# Patient Record
Sex: Female | Born: 1985
Health system: Southern US, Community
[De-identification: ages and names within clinical notes are randomized; demographics above are authoritative.]

## PROBLEM LIST (undated history)

## (undated) HISTORY — PX: MOUTH SURGERY: SHX715

---
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
## 2011-07-02 ENCOUNTER — Encounter (HOSPITAL_BASED_OUTPATIENT_CLINIC_OR_DEPARTMENT_OTHER): Payer: Self-pay | Admitting: *Deleted

## 2011-07-02 ENCOUNTER — Inpatient Hospital Stay (HOSPITAL_BASED_OUTPATIENT_CLINIC_OR_DEPARTMENT_OTHER)
Admission: EM | Admit: 2011-07-02 | Discharge: 2011-07-03 | Disposition: A | Discharge status: Home / Self Care | Payer: BC Managed Care – PPO | Attending: Obstetrics & Gynecology | Admitting: Obstetrics & Gynecology

## 2011-07-02 DIAGNOSIS — O200 Threatened abortion: Secondary | ICD-10-CM | POA: Insufficient documentation

## 2011-07-02 LAB — BASIC METABOLIC PANEL
BUN: 14 mg/dL (ref 6–23)
GFR calc Af Amer: >90 mL/min (ref >90)
GFR calc non Af Amer: >90 mL/min (ref >90)
Potassium: 4 mEq/L (ref 3.5–5.1)
Sodium: 140 mEq/L (ref 135–145)

## 2011-07-02 LAB — URINALYSIS, ROUTINE W REFLEX MICROSCOPIC
Bilirubin Urine: NEGATIVE
Glucose, UA: NEGATIVE mg/dL
Ketones, ur: NEGATIVE mg/dL
pH: 5 (ref 5.0–8.0)

## 2011-07-02 LAB — CBC
Platelets: 202 10*3/uL (ref 150–400)
RDW: 12.2 % (ref 11.5–15.5)
WBC: 7.9 10*3/uL (ref 4.0–10.5)

## 2011-07-02 LAB — HCG, QUANTITATIVE, PREGNANCY: hCG, Beta Chain, Quant, S: 904 m[IU]/mL — ABNORMAL HIGH (ref <5)

## 2011-07-02 LAB — WET PREP, GENITAL: Yeast Wet Prep HPF POC: NONE SEEN

## 2011-07-02 LAB — URINE MICROSCOPIC-ADD ON

## 2011-07-02 NOTE — ED Notes (Signed)
Pt states that 5 days ago she began having a small amount of bleeding and has progressed to heavy bleeding over last 2 days states that she has not seen her GYN but has called to make an appointment states that when she called for an appointment they "estimated her to be 10 weeks. Pt with mild abd cramping

## 2011-07-03 ENCOUNTER — Encounter (HOSPITAL_COMMUNITY): Payer: Self-pay | Admitting: *Deleted

## 2011-07-03 ENCOUNTER — Inpatient Hospital Stay (HOSPITAL_COMMUNITY): Payer: BC Managed Care – PPO

## 2011-07-03 DIAGNOSIS — O200 Threatened abortion: Secondary | ICD-10-CM

## 2011-07-03 NOTE — ED Notes (Signed)
Comfort information not initiated at this time due to unsure outcome  , pt to return on Sunday for follow up.

## 2011-07-03 NOTE — ED Provider Notes (Addendum)
History     CSN: [id number]  Arrival date & time 07/02/11  2143   First MD Initiated Contact with Patient 07/02/11 2255      Chief Complaint  Patient presents with  . Vaginal Bleeding    (Consider location/radiation/quality/duration/timing/severity/associated sxs/prior treatment) Patient is a 26 y.o. female presenting with vaginal bleeding. The history is provided by the patient. No language interpreter was used.  Vaginal Bleeding This is a new problem. The current episode started more than 2 days ago (6 days ago). The problem occurs constantly. The problem has been gradually worsening. Pertinent negatives include no chest pain, no abdominal pain, no headaches and no shortness of breath. The symptoms are aggravated by nothing. The symptoms are relieved by nothing. She has tried nothing for the symptoms.  G2P1 LMP 11/20 was normal for her.  This evening started cramping L> R.  Passing clots.  Has not seen a GYN.  No trauma.  No f/c/r.  No diarrhea.    History reviewed. No pertinent past medical history.  History reviewed. No pertinent past surgical history.  History reviewed. No pertinent family history.  History  Substance Use Topics  . Smoking status: Current Everyday Smoker  . Smokeless tobacco: Not on file  . Alcohol Use: No    OB History    Grav Para Term Preterm Abortions TAB SAB Ect Mult Living   2 1 1       1       Review of Systems  Constitutional: Negative.   HENT: Negative.   Eyes: Negative.   Respiratory: Negative for shortness of breath.   Cardiovascular: Negative for chest pain.  Gastrointestinal: Negative for abdominal pain.  Genitourinary: Positive for vaginal bleeding.  Musculoskeletal: Negative.   Skin: Negative.   Neurological: Negative for headaches.  Hematological: Negative.   Psychiatric/Behavioral: Negative.     Allergies  Penicillins  Home Medications   Current Outpatient Rx  Name Route Sig Dispense Refill  . FLINTSTONES/EXTRA C PO  CHEW Oral Chew 1 tablet by mouth daily.      BP 133/68  Pulse 94  Temp(Src) 98 F (36.7 C) (Oral)  Resp 18  Ht 5\' 8"  (1.727 m)  Wt 155 lb (70.308 kg)  BMI 23.57 kg/m2  SpO2 100%  LMP 04/14/2011  Physical Exam  Constitutional: She is oriented to person, place, and time. She appears well-developed and well-nourished. No distress.  HENT:  Head: Normocephalic and atraumatic.  Mouth/Throat: Oropharynx is clear and moist.  Eyes: Conjunctivae are normal. Pupils are equal, round, and reactive to light.  Neck: Normal range of motion. Neck supple.  Cardiovascular: Normal rate and regular rhythm.   Pulmonary/Chest: Effort normal and breath sounds normal. She has no wheezes.  Abdominal: Soft. Bowel sounds are normal. There is no tenderness. There is no rebound and no guarding.  Genitourinary: There is bleeding around the vagina.       Os closed mild bleeding chaperone present  Musculoskeletal: Normal range of motion.  Neurological: She is alert and oriented to person, place, and time.  Psychiatric: She has a normal mood and affect.    ED Course  Procedures (including critical care time)  Labs Reviewed  URINALYSIS, ROUTINE W REFLEX MICROSCOPIC - Abnormal; Notable for the following:    APPearance CLOUDY (*)    Hgb urine dipstick LARGE (*)    All other components within normal limits  PREGNANCY, URINE - Abnormal; Notable for the following:    Preg Test, Ur POSITIVE (*)    All  other components within normal limits  URINE MICROSCOPIC-ADD ON - Abnormal; Notable for the following:    Squamous Epithelial / LPF MANY (*)    Bacteria, UA FEW (*)    All other components within normal limits  HCG, QUANTITATIVE, PREGNANCY - Abnormal; Notable for the following:    hCG, Beta Chain, Quant, S 904 (*)    All other components within normal limits  BASIC METABOLIC PANEL - Abnormal; Notable for the following:    Glucose, Bld 101 (*)    All other components within normal limits  CBC  WET PREP,  GENITAL  ABO/RH  GC/CHLAMYDIA PROBE AMP, GENITAL   No results found.   1. Threatened abortion       MDM  Case d/w Dr. Macon Large will accept in transfer to r/o ectopic.          April K Palumbo-Rasch, MD 07/03/11 0031  Results for orders placed during the hospital encounter of 07/02/11 (from the past 24 hour(s))  URINALYSIS, ROUTINE W REFLEX MICROSCOPIC     Status: Abnormal   Collection Time   07/02/11 10:00 PM      Component Value Range   Color, Urine YELLOW  YELLOW    APPearance CLOUDY (*) CLEAR    Specific Gravity, Urine 1.028  1.005 - 1.030    pH 5.0  5.0 - 8.0    Glucose, UA NEGATIVE  NEGATIVE (mg/dL)   Hgb urine dipstick LARGE (*) NEGATIVE    Bilirubin Urine NEGATIVE  NEGATIVE    Ketones, ur NEGATIVE  NEGATIVE (mg/dL)   Protein, ur NEGATIVE  NEGATIVE (mg/dL)   Urobilinogen, UA 0.2  0.0 - 1.0 (mg/dL)   Nitrite NEGATIVE  NEGATIVE    Leukocytes, UA NEGATIVE  NEGATIVE   PREGNANCY, URINE     Status: Abnormal   Collection Time   07/02/11 10:00 PM      Component Value Range   Preg Test, Ur POSITIVE (*) NEGATIVE   URINE MICROSCOPIC-ADD ON     Status: Abnormal   Collection Time   07/02/11 10:00 PM      Component Value Range   Squamous Epithelial / LPF MANY (*) RARE    WBC, UA 0-2  <3 (WBC/hpf)   RBC / HPF TOO NUMEROUS TO COUNT  <3 (RBC/hpf)   Bacteria, UA FEW (*) RARE   HCG, QUANTITATIVE, PREGNANCY     Status: Abnormal   Collection Time   07/02/11 10:45 PM      Component Value Range   hCG, Beta Chain, Quant, S 904 (*) <5 (mIU/mL)  BASIC METABOLIC PANEL     Status: Abnormal   Collection Time   07/02/11 10:45 PM      Component Value Range   Sodium 140  135 - 145 (mEq/L)   Potassium 4.0  3.5 - 5.1 (mEq/L)   Chloride 104  96 - 112 (mEq/L)   CO2 28  19 - 32 (mEq/L)   Glucose, Bld 101 (*) 70 - 99 (mg/dL)   BUN 14  6 - 23 (mg/dL)   Creatinine, Ser 9.56  0.50 - 1.10 (mg/dL)   Calcium 9.5  8.4 - 21.3 (mg/dL)   GFR calc non Af Amer >90  >90 (mL/min)   GFR calc Af Amer  >90  >90 (mL/min)  CBC     Status: Normal   Collection Time   07/02/11 10:46 PM      Component Value Range   WBC 7.9  4.0 - 10.5 (K/uL)   RBC 4.13  3.87 -  5.11 (MIL/uL)   Hemoglobin 13.2  12.0 - 15.0 (g/dL)   HCT 69.6  29.5 - 28.4 (%)   MCV 93.2  78.0 - 100.0 (fL)   MCH 32.0  26.0 - 34.0 (pg)   MCHC 34.3  30.0 - 36.0 (g/dL)   RDW 13.2  44.0 - 10.2 (%)   Platelets 202  150 - 400 (K/uL)  ABO/RH     Status: Normal   Collection Time   07/02/11 10:47 PM      Component Value Range   ABO/RH(D) A POS    WET PREP, GENITAL     Status: Normal   Collection Time   07/02/11 11:00 PM      Component Value Range   Yeast Wet Prep HPF POC NONE SEEN  NONE SEEN    Trich, Wet Prep NONE SEEN  NONE SEEN    Clue Cells Wet Prep HPF POC NONE SEEN  NONE SEEN    WBC, Wet Prep HPF POC NONE SEEN  NONE SEEN    US Ob Comp Less 14 Wks  07/03/2011  *RADIOLOGY REPORT*  Clinical Data: Bleeding, pregnant  OBSTETRIC <14 WK [place] AND TRANSVAGINAL OB US  Technique:  Both transabdominal and transvaginal ultrasound examinations were performed for complete evaluation of the gestation as well as the maternal uterus, adnexal regions, and pelvic cul-de-sac.  Transvaginal technique was performed to assess early pregnancy.  Comparison:  None.  Intrauterine gestational sac:  Visualized, mildly irregular shape. Yolk sac: Not identified Embryo: Not identified Cardiac Activity: Not applicable.  MSD: 21  mm  seven    w one    d  Maternal uterus/adnexae: Normal sonographic appearance to the ovaries.  No free fluid.  IMPRESSION: No yolk sac or embryo identified within an intrauterine gestational sac measuring 21 mm. This raises the possibility for anembryonic pregnancy (blighted ovum).  Correlate clinically and if warranted, with a short-term ultrasound follow-up.  Original Report Authenticated By: Waneta Martins, M.D.   US Ob Transvaginal  07/03/2011  *RADIOLOGY REPORT*  Clinical Data: Bleeding, pregnant  OBSTETRIC <14 WK [place] AND TRANSVAGINAL OB  US  Technique:  Both transabdominal and transvaginal ultrasound examinations were performed for complete evaluation of the gestation as well as the maternal uterus, adnexal regions, and pelvic cul-de-sac.  Transvaginal technique was performed to assess early pregnancy.  Comparison:  None.  Intrauterine gestational sac:  Visualized, mildly irregular shape. Yolk sac: Not identified Embryo: Not identified Cardiac Activity: Not applicable.  MSD: 21  mm  seven    w one    d  Maternal uterus/adnexae: Normal sonographic appearance to the ovaries.  No free fluid.  IMPRESSION: No yolk sac or embryo identified within an intrauterine gestational sac measuring 21 mm. This raises the possibility for anembryonic pregnancy (blighted ovum).  Correlate clinically and if warranted, with a short-term ultrasound follow-up.  Original Report Authenticated By: Waneta Martins, M.D.    A/P: 26 y.o. G2P1001 at unknown EGA with vaginal bleeding Likely anembryonic pregnnacy - f/u in 48 hours for repeat quant Precautions rev'd

## 2011-07-03 NOTE — Progress Notes (Signed)
Pt transfer in from [facility], presented there tonight with vaginal bleeding in early pregnancy

## 2011-07-04 LAB — GC/CHLAMYDIA PROBE AMP, GENITAL: Chlamydia, DNA Probe: NEGATIVE

## 2011-07-06 ENCOUNTER — Inpatient Hospital Stay (HOSPITAL_COMMUNITY)
Admission: AD | Admit: 2011-07-06 | Discharge: 2011-07-06 | Disposition: A | Discharge status: Home / Self Care | Payer: BC Managed Care – PPO | Source: Ambulatory Visit | Attending: Obstetrics & Gynecology | Admitting: Obstetrics & Gynecology

## 2011-07-06 DIAGNOSIS — O0289 Other abnormal products of conception: Secondary | ICD-10-CM

## 2011-07-06 DIAGNOSIS — O209 Hemorrhage in early pregnancy, unspecified: Secondary | ICD-10-CM | POA: Insufficient documentation

## 2011-07-06 DIAGNOSIS — O020 Blighted ovum and nonhydatidiform mole: Secondary | ICD-10-CM

## 2011-07-06 LAB — HCG, QUANTITATIVE, PREGNANCY: hCG, Beta Chain, Quant, S: 457 m[IU]/mL — ABNORMAL HIGH (ref <5)

## 2011-07-06 NOTE — ED Provider Notes (Signed)
Attestation of Attending Supervision of Advanced Practitioner: Evaluation and management procedures were performed by the PA/NP/CNM/OB Fellow under my supervision/collaboration. Chart reviewed, and agree with management and plan.  Mishawn Didion, M.D. 07/06/2011 1:45 PM   

## 2011-07-06 NOTE — ED Provider Notes (Signed)
History   Chief Complaint:  No chief complaint on file.   Lori Baker is  26 y.o. G2P1001 Patient's last menstrual period was 04/14/2011.Marland Kitchen Patient is here for follow up of quantitative HCG and ongoing surveillance of pregnancy status.   Seen on 07/02/11 with vaginal bleeding. She is Unknown weeks gestation.   Since her last visit, the patient is without new complaint.   The patient reports bleeding as  lighter than period.  Denies pain.   General ROS:  negative  Her previous Quantitative HCG values are: 2/7 904        Last menstrual period 04/14/2011.  Physical Exam  Nursing note reviewed. Constitutional: She is oriented to person, place, and time. She appears well-developed and well-nourished. No distress.  Cardiovascular: Normal rate.   Pulmonary/Chest: Effort normal.  Musculoskeletal: Normal range of motion.  Neurological: She is alert and oriented to person, place, and time.  Skin: Skin is warm and dry.  Psychiatric: She has a normal mood and affect.     Labs: Recent Results (from the past 24 hour(s))  HCG, QUANTITATIVE, PREGNANCY   Collection Time   07/06/11 10:50 AM      Component Value Range   hCG, Beta Chain, Quant, S 457 (*) <5 (mIU/mL)    Ultrasound Studies:   US Ob Comp Less 14 Wks  07/03/2011  *RADIOLOGY REPORT*  Clinical Data: Bleeding, pregnant  OBSTETRIC <14 WK [place] AND TRANSVAGINAL OB US  Technique:  Both transabdominal and transvaginal ultrasound examinations were performed for complete evaluation of the gestation as well as the maternal uterus, adnexal regions, and pelvic cul-de-sac.  Transvaginal technique was performed to assess early pregnancy.  Comparison:  None.  Intrauterine gestational sac:  Visualized, mildly irregular shape. Yolk sac: Not identified Embryo: Not identified Cardiac Activity: Not applicable.  MSD: 21  mm  seven    w one    d  Maternal uterus/adnexae: Normal sonographic appearance to the ovaries.  No free fluid.  IMPRESSION: No yolk sac  or embryo identified within an intrauterine gestational sac measuring 21 mm. This raises the possibility for anembryonic pregnancy (blighted ovum).  Correlate clinically and if warranted, with a short-term ultrasound follow-up.  Original Report Authenticated By: Waneta Martins, M.D.   US Ob Transvaginal  07/03/2011  *RADIOLOGY REPORT*  Clinical Data: Bleeding, pregnant  OBSTETRIC <14 WK [place] AND TRANSVAGINAL OB US  Technique:  Both transabdominal and transvaginal ultrasound examinations were performed for complete evaluation of the gestation as well as the maternal uterus, adnexal regions, and pelvic cul-de-sac.  Transvaginal technique was performed to assess early pregnancy.  Comparison:  None.  Intrauterine gestational sac:  Visualized, mildly irregular shape. Yolk sac: Not identified Embryo: Not identified Cardiac Activity: Not applicable.  MSD: 21  mm  seven    w one    d  Maternal uterus/adnexae: Normal sonographic appearance to the ovaries.  No free fluid.  IMPRESSION: No yolk sac or embryo identified within an intrauterine gestational sac measuring 21 mm. This raises the possibility for anembryonic pregnancy (blighted ovum).  Correlate clinically and if warranted, with a short-term ultrasound follow-up.  Original Report Authenticated By: Waneta Martins, M.D.    Assessment: 26 y.o. G2P1001 with anembryonic pregnancy, declining HCG   Plan: The patient is instructed to follow up in in 1 week in GYN clinic for repeat quant. Precautions rev'd.  Damary Doland 07/06/2011, 5:40 PM

## 2011-07-13 ENCOUNTER — Other Ambulatory Visit: Payer: BC Managed Care – PPO

## 2013-03-10 IMAGING — US US OB TRANSVAGINAL
1 series · 14 of 28 positions shown · non-contrast
Comparison: None.

CLINICAL DATA: Bleeding, pregnant

OBSTETRIC <14 WK US AND TRANSVAGINAL OB US
TECHNIQUE: Both transabdominal and transvaginal ultrasound
examinations were performed for complete evaluation of the
gestation as well as the maternal uterus, adnexal regions, and
pelvic cul-de-sac.  Transvaginal technique was performed to assess
early pregnancy.

[Series 1: us ob transvaginal · 34 acquisitions, 14 frames shown]
[im 2/34]
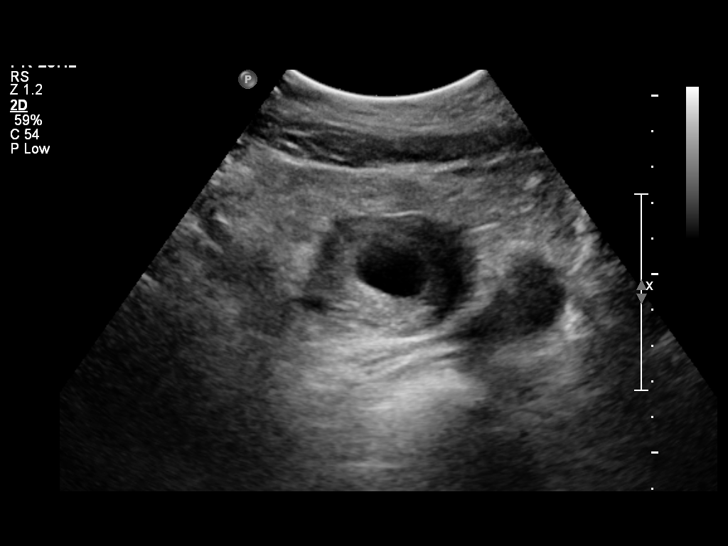
[im 4/34]
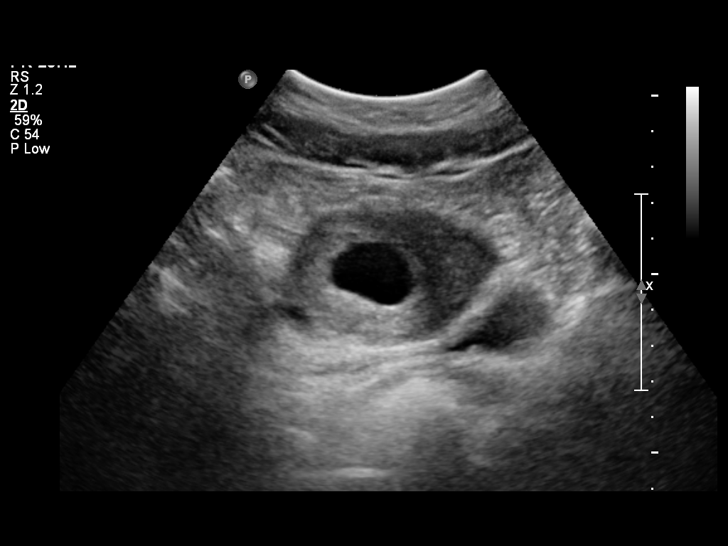
[im 7/34]
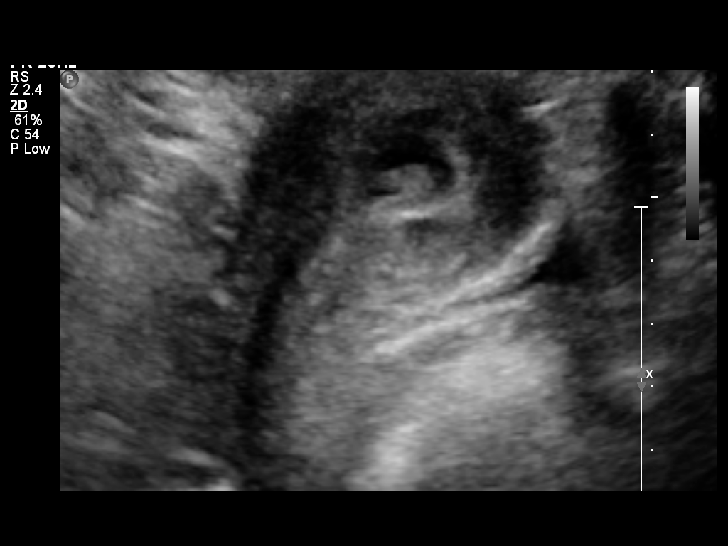
[im 9/34]
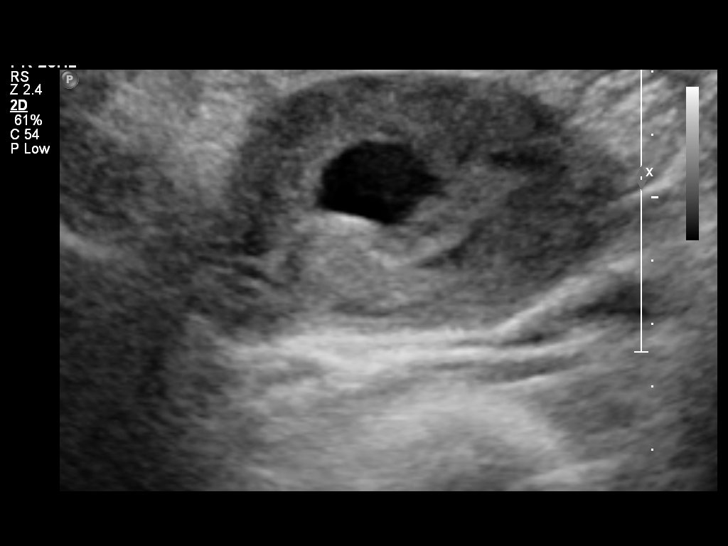
[im 12/34]
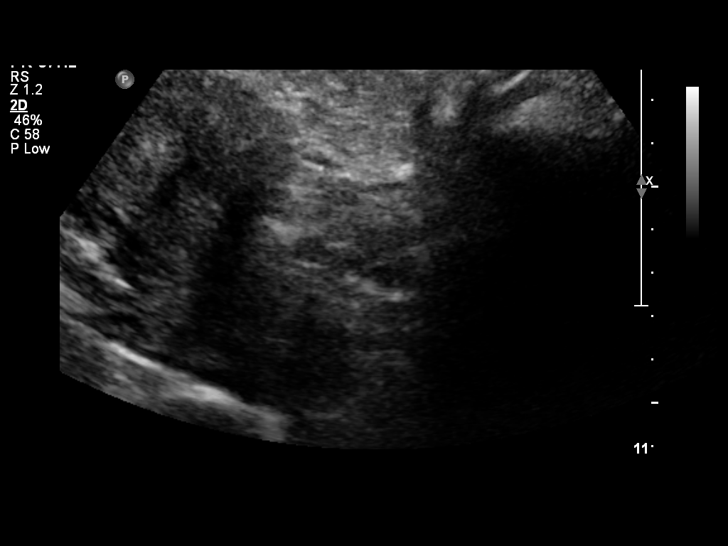
[im 14/34]
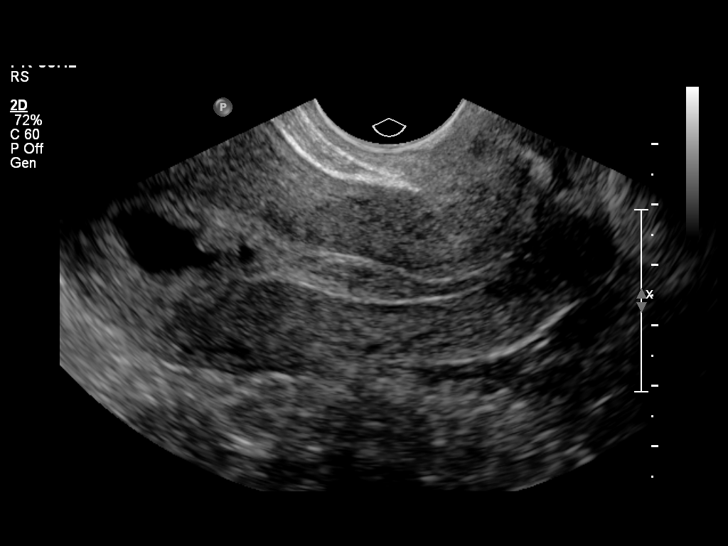
[im 16/34]
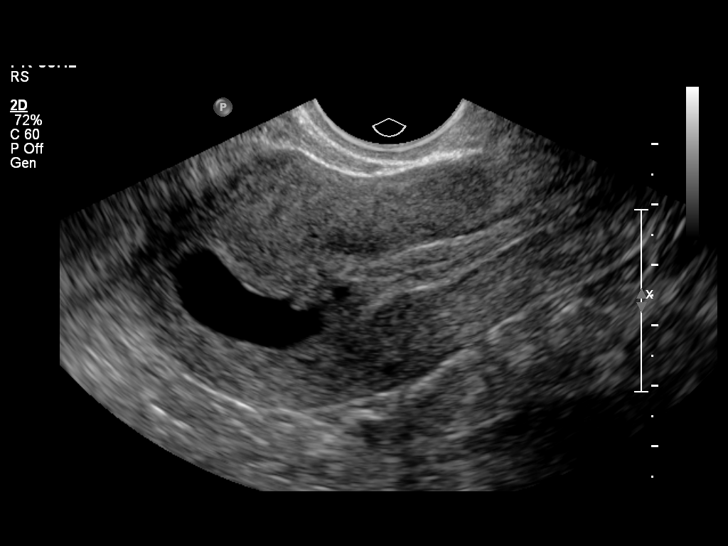
[im 19/34]
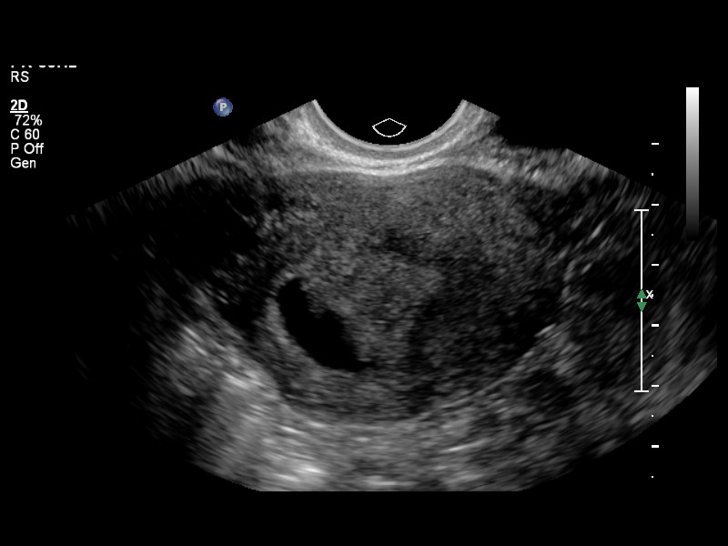
[im 21/34]
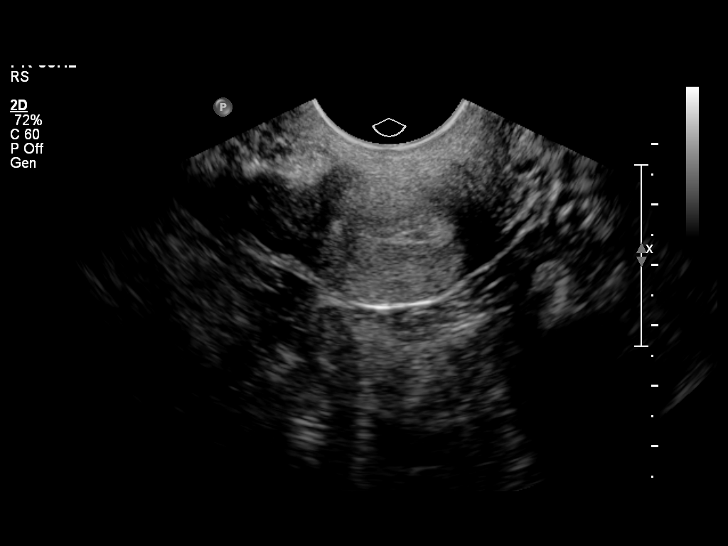
[im 24/34]
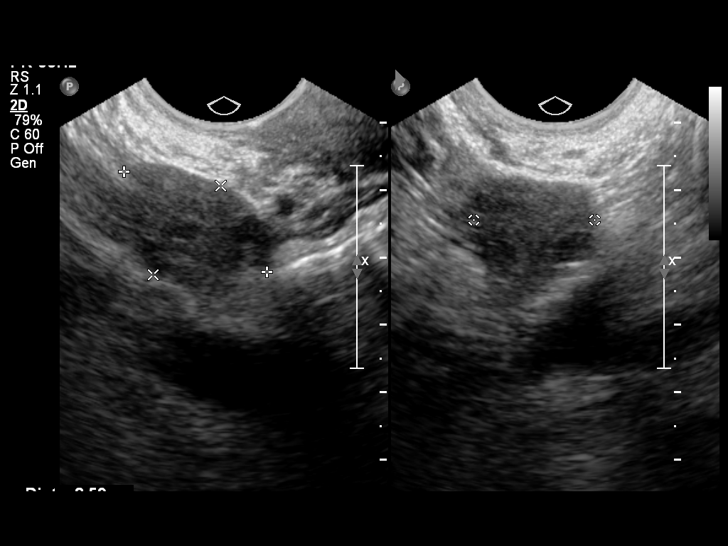
[im 26/34]
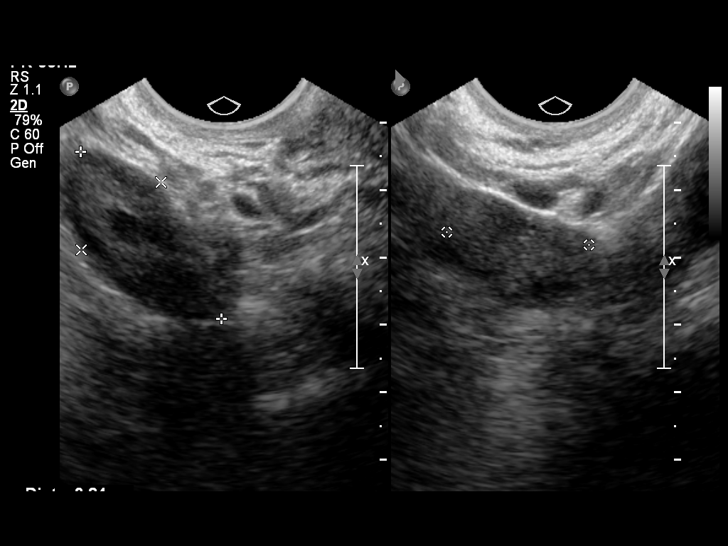
[im 29/34]
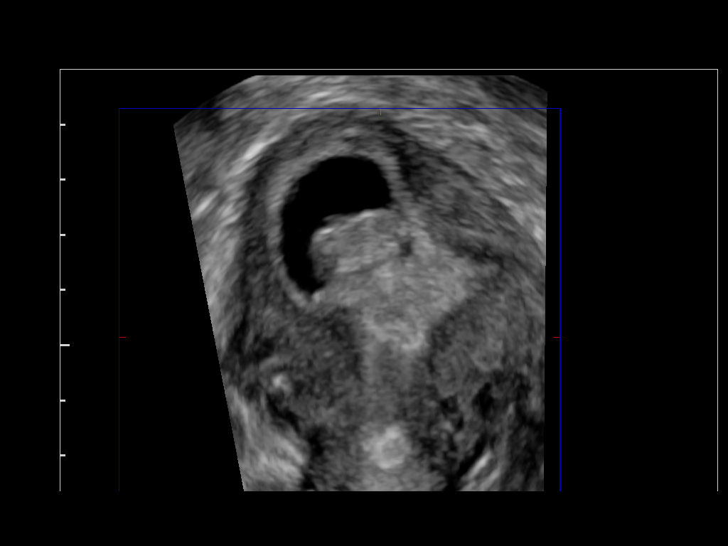
[im 31/34]
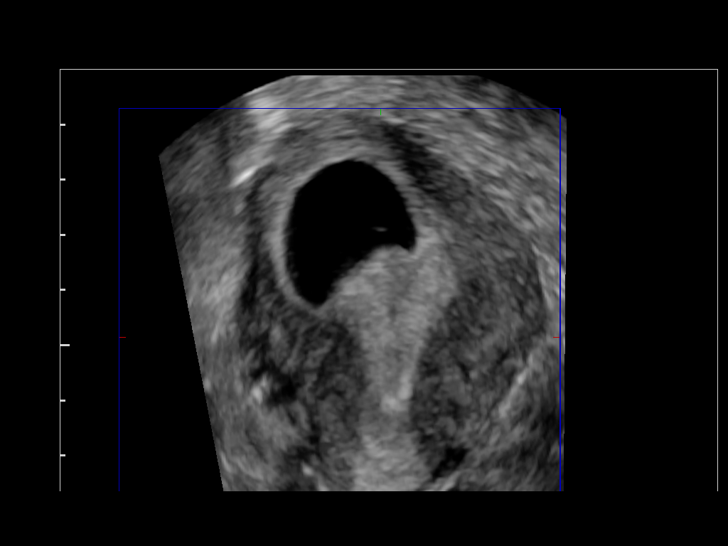
[im 34/34]
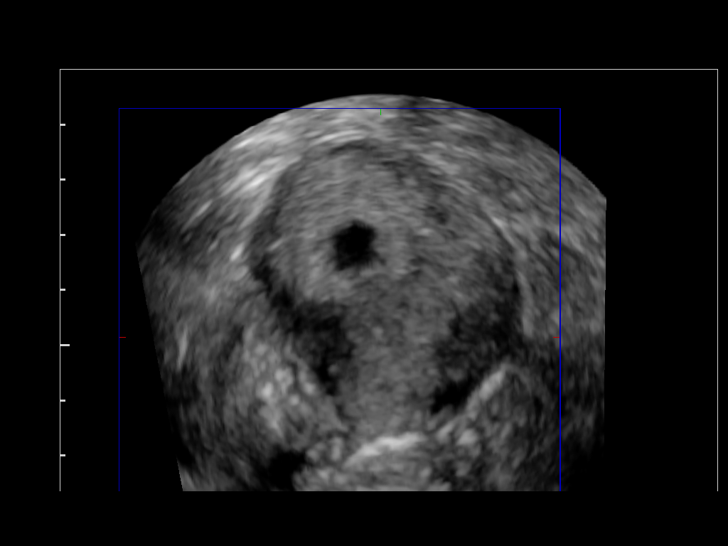

[14 of 28 positions shown; findings below may reference images not displayed]

Intrauterine gestational sac:  Visualized, mildly irregular shape.
Yolk sac: Not identified
Embryo: Not identified
Cardiac Activity: Not applicable.

MSD: 21  mm  seven    w one    d

Maternal uterus/adnexae:
Normal sonographic appearance to the ovaries.  No free fluid.
IMPRESSION: No yolk sac or embryo identified within an intrauterine gestational
sac measuring 21 mm. This raises the possibility for anembryonic
pregnancy (blighted ovum).  Correlate clinically and if warranted,
with a short-term ultrasound follow-up.

## 2014-03-26 ENCOUNTER — Encounter (HOSPITAL_COMMUNITY): Payer: Self-pay | Admitting: *Deleted

## 2017-12-05 ENCOUNTER — Other Ambulatory Visit: Payer: Self-pay

## 2017-12-05 ENCOUNTER — Emergency Department (HOSPITAL_COMMUNITY)
Admission: EM | Admit: 2017-12-05 | Discharge: 2017-12-05 | Disposition: A | Discharge status: Home / Self Care | Payer: Medicaid Other | Attending: Emergency Medicine | Admitting: Emergency Medicine

## 2017-12-05 DIAGNOSIS — L02416 Cutaneous abscess of left lower limb: Secondary | ICD-10-CM | POA: Diagnosis not present

## 2017-12-05 DIAGNOSIS — R2242 Localized swelling, mass and lump, left lower limb: Secondary | ICD-10-CM | POA: Diagnosis present

## 2017-12-05 DIAGNOSIS — L0291 Cutaneous abscess, unspecified: Secondary | ICD-10-CM

## 2017-12-05 DIAGNOSIS — F17210 Nicotine dependence, cigarettes, uncomplicated: Secondary | ICD-10-CM | POA: Diagnosis not present

## 2017-12-05 DIAGNOSIS — Z79899 Other long term (current) drug therapy: Secondary | ICD-10-CM | POA: Insufficient documentation

## 2017-12-05 MED ORDER — SULFAMETHOXAZOLE-TRIMETHOPRIM 800-160 MG PO TABS: 1.0000 | ORAL_TABLET | Freq: Once | ORAL | Status: DC

## 2017-12-05 MED ORDER — SULFAMETHOXAZOLE-TRIMETHOPRIM 800-160 MG PO TABS: 1.0000 | ORAL_TABLET | Freq: Two times a day (BID) | ORAL | 0 refills | Status: AC

## 2017-12-05 MED ORDER — LIDOCAINE-EPINEPHRINE (PF) 2 %-1:200000 IJ SOLN
10.0000 mL | Freq: Once | INTRAMUSCULAR | Status: DC
  Filled 2017-12-05: qty 20

## 2017-12-05 NOTE — ED Provider Notes (Signed)
[facility] Provider Note   CSN: [id number] Arrival date & time: 12/05/17  0047     History   Chief Complaint Chief Complaint  Patient presents with  . Abscess    HPI Lori ChenWendy G Gotay is a 32 y.o. female.  HPI 32 year old female with no pertinent past medical history presents to the emergency department today for evaluation of abscess to her left lateral thigh.  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  Her husband is being treated for bacteremia and MRSA.  Patient denies associated fevers, chills, chest pain, shortness of breath, palpitations, nausea, vomiting.  The abscess developed 2 days ago has progressively worsened.  He has not taken anything for symptoms prior to arrival.  Patient denies any history of MRSA or recurrent abscess formation.  Denies any other associated symptoms. Past Medical History:  Diagnosis Date  . Asthma     There are no active problems to display for this patient.   Past Surgical History:  Procedure Laterality Date  . MOUTH SURGERY       OB History    Gravida  2   Para  1   Term  1   Preterm      AB      Living  1     SAB      TAB      Ectopic      Multiple      Live Births               Home Medications    Prior to Admission medications   Medication Sig Start Date End Date Taking? Authorizing Provider  multivitamin (BARIATRIC VIT W/EXTRA C) CHEW Chew 1 tablet by mouth daily.    [provider]  sulfamethoxazole-trimethoprim (BACTRIM DS,SEPTRA DS) 800-160 MG tablet Take 1 tablet by mouth 2 (two) times daily for 7 days. 12/05/17 12/12/17  Rise MuLeaphart, Kenneth T, PA-C    Family History Family History  Problem Relation Age of Onset  . Hypertension Mother     Social History Social History   Tobacco Use  . Smoking status: Current Every Day Smoker    Packs/day: 0.25  Substance Use Topics  . Alcohol use: No  . Drug use: No     Allergies   Penicillins   Review of  Systems Review of Systems  Constitutional: Negative for chills and fever.  Respiratory: Negative for cough.   Cardiovascular: Negative for chest pain and palpitations.  Gastrointestinal: Negative for vomiting.  Skin: Positive for wound.  Neurological: Negative for headaches.     Physical Exam Updated Vital Signs BP 102/77 (BP Location: Left Arm)   Pulse 82   Temp 98.4 F (36.9 C) (Oral)   Resp 16   Ht 5\' 7"  (1.702 m)   Wt 59 kg (130 lb)   LMP 11/22/2017 (Approximate)   SpO2 100%   BMI 20.36 kg/m   Physical Exam  Constitutional: She appears well-developed and well-nourished. No distress.  HENT:  Head: Normocephalic and atraumatic.  Eyes: Right eye exhibits no discharge. Left eye exhibits no discharge. No scleral icterus.  Neck: Normal range of motion.  Pulmonary/Chest: No respiratory distress.  Musculoskeletal: Normal range of motion.  Neurological: She is alert.  Skin: Skin is warm and dry. Capillary refill takes less than 2 seconds. No pallor.  Patient has approximately 4 cm area of induration, erythema and warmth with 2 x 2 centimeter area of fluctuance to the left lateral thigh.  Tender to  palpation.  Patient has several track marks noted to the over the entire body.  No purulent drainage noted.  Skin compartments are soft.  Psychiatric: Her behavior is normal. Judgment and thought content normal.  Nursing note and vitals reviewed.    ED Treatments / Results  Labs (all labs ordered are listed, but only abnormal results are displayed) Labs Reviewed  AEROBIC CULTURE (SUPERFICIAL SPECIMEN)    EKG None  Radiology No results found.  Procedures .Marland KitchenIncision and Drainage Date/Time: 12/05/2017 8:32 AM Performed by: Rise Mu, PA-C Authorized by: Rise Mu, PA-C   Consent:    Consent obtained:  Verbal   Consent given by:  Patient   Risks discussed:  Bleeding, damage to other organs, infection, incomplete drainage and pain   Alternatives  discussed:  No treatment Location:    Type:  Abscess   Size:  4cm   Location:  Lower extremity   Lower extremity location:  Leg   Leg location:  L upper leg Pre-procedure details:    Skin preparation:  Betadine Anesthesia (see MAR for exact dosages):    Anesthesia method:  Local infiltration   Local anesthetic:  Lidocaine 1% WITH epi Procedure type:    Complexity:  Simple Procedure details:    Needle aspiration: no     Incision types:  Cruciate   Incision depth:  Dermal   Scalpel blade:  11   Wound management:  Probed and deloculated and irrigated with saline   Drainage:  Bloody and purulent   Drainage amount:  Copious   Wound treatment:  Wound left open   Packing material: Patient refused. Post-procedure details:    Patient tolerance of procedure:  Tolerated well, no immediate complications   (including critical care time)  Medications Ordered in ED Medications - No data to display   Initial Impression / Assessment and Plan / ED Course  I have reviewed the triage vital signs and the nursing notes.  Pertinent labs & imaging results that were available during my care of the patient were reviewed by me and considered in my medical decision making (see chart for details).     Patient with skin abscess amenable to incision and drainage.  She was large enough to warrant packing however patient refused.  Patient's husband is being treated for bacteremia and MRSA infection and they do report sharing needles for IV drug use.  I will send wound culture.  Encouraged recheck in 2 days.  Area was demarcated.  Patient has no systemic symptoms.  And afebrile.  No indication for further blood work at this time.Marland Kitchen  He should not Encouraged home warm soaks and flushing.  Patient started on Bactrim.  Pt is hemodynamically stable, in NAD, & able to ambulate in the ED. Evaluation does not show pathology that would require ongoing emergent intervention or inpatient treatment. I explained the  diagnosis to the patient. Pain has been managed & has no complaints prior to dc. Pt is comfortable with above plan and is stable for discharge at this time. All questions were answered prior to disposition. Strict return precautions for f/u to the ED were discussed. Encouraged follow up with PCP.    Final Clinical Impressions(s) / ED Diagnoses   Final diagnoses:  Abscess    ED Discharge Orders        Ordered    sulfamethoxazole-trimethoprim (BACTRIM DS,SEPTRA DS) 800-160 MG tablet  2 times daily     12/05/17 0410       Leaphart, Lynann Beaver,  PA-C 12/05/17 1610    Dione Booze, MD 12/05/17 2253

## 2017-12-05 NOTE — ED Triage Notes (Signed)
Patient c/o abscess to left, lateral thigh.

## 2017-12-05 NOTE — ED Notes (Signed)
Pt states that she is a IV drug user and that this particular abscess is from her and her husband sharing a needle. Pt states husband is upstairs being treated for staff infection.

## 2017-12-05 NOTE — ED Notes (Signed)
  Patient left after abscess was drained and did not want paperwork or antibiotics.  Patient did not want to stay and kept walking out.  MD notified.

## 2017-12-05 NOTE — Discharge Instructions (Addendum)
You have been treated for an abscess in the ED.  ° °There are sign of surrounding infection. Please take all of your antibiotics until finished!   You may develop abdominal discomfort or diarrhea from the antibiotic. You may help offset this with probiotics which you can buy or get in yogurt.  ° °May take tylenol and motrin as needed for pain. Warm compress to the area to help with the healing process. Avoid swimming for 1 week. May soak in warm water to help with pain and the healing process.  ° °Follow up with your doctor, an urgent care, or return to ED in order to remove your packing in 48-72 hours. If you do not have packing return in 48-72 hours for wound recheck. Return to the emergency department if you develop a fever, your abscess appears to become more infected (growing surrounding redness and warmth), new or worsening symptoms develop, any additional concerns.  ° °Abscess °An abscess (boil or furuncle) is an infected area that contains a collection of pus.  ° °SYMPTOMS °Signs and symptoms of an abscess include pain, tenderness, redness, or hardness. You may feel a moveable soft area under your skin. An abscess can occur anywhere in the body.  ° °TREATMENT  °A surgical cut (incision) may be made over your abscess to drain the pus. Gauze may be packed into the space or a drain may be looped through the abscess cavity (pocket). This provides a drain that will allow the cavity to heal from the inside outwards. The abscess may be painful for a few days, but should feel much better if it was drained.  °Your abscess, if seen early, may not have localized and may not have been drained. If not, another appointment may be required if it does not get better on its own or with medications. ° °HOME CARE INSTRUCTIONS  °Keep the skin and clothes clean around your abscess.  °If the abscess was drained, you will need to use gauze dressing to collect any draining pus. Dressings will typically need to be changed 3 or more  times a day.  °The infection may spread by skin contact with others. Avoid skin contact as much as possible.  °Practice good hygiene. This includes regular hand washing, cover any draining skin lesions, and do not share personal care items.  °SEEK MEDICAL CARE IF:  °You develop increased pain, swelling, redness, drainage, or bleeding in the wound site.  °You develop signs of generalized infection including muscle aches, chills, fever, or a general ill feeling.  °You have an oral temperature above 102° F (38.9° C).  °MAKE SURE YOU:  °Understand these instructions.  °Will watch your condition.  °Will get help right away if you are not doing well or get worse.  °Document Released: 02/18/2005 Document Revised: 01/21/2011 Document Reviewed: 12/13/2007 °[employer]® Patient Information ©2012 [employer]. ° ° ° °

## 2017-12-08 ENCOUNTER — Encounter (HOSPITAL_COMMUNITY): Payer: Self-pay

## 2017-12-08 ENCOUNTER — Other Ambulatory Visit: Payer: Self-pay

## 2017-12-08 ENCOUNTER — Emergency Department (HOSPITAL_COMMUNITY)
Admission: EM | Admit: 2017-12-08 | Discharge: 2017-12-08 | Discharge status: Against Medical Advice | Payer: Medicaid Other | Attending: Emergency Medicine | Admitting: Emergency Medicine

## 2017-12-08 DIAGNOSIS — Z5321 Procedure and treatment not carried out due to patient leaving prior to being seen by health care provider: Secondary | ICD-10-CM | POA: Diagnosis not present

## 2017-12-08 DIAGNOSIS — L02416 Cutaneous abscess of left lower limb: Secondary | ICD-10-CM | POA: Insufficient documentation

## 2017-12-08 NOTE — ED Triage Notes (Signed)
Pt here for abscess to left lateral thigh 3 days ago, it was cut open and marked. The redness has moved outside of the marked area. VSS. Afebrile.

## 2017-12-08 NOTE — ED Notes (Signed)
Unable to locate patient in room x15 minutes, pt was in gown and gown is now on bed, no belongings located in room, attempted to call patient

## 2017-12-08 NOTE — ED Notes (Signed)
Still unable to locate patient, number in chart no longer belongs to patient

## 2018-05-05 ENCOUNTER — Encounter (HOSPITAL_COMMUNITY): Payer: Self-pay | Admitting: Emergency Medicine

## 2018-05-05 ENCOUNTER — Emergency Department (HOSPITAL_COMMUNITY): Payer: Self-pay | Admitting: Anesthesiology

## 2018-05-05 ENCOUNTER — Other Ambulatory Visit: Payer: Self-pay

## 2018-05-05 ENCOUNTER — Encounter (HOSPITAL_COMMUNITY)
Admission: EM | Disposition: A | Discharge status: Home / Self Care | Payer: Self-pay | Source: Home / Self Care | Attending: Internal Medicine

## 2018-05-05 ENCOUNTER — Inpatient Hospital Stay (HOSPITAL_COMMUNITY)
Admission: EM | Admit: 2018-05-05 | Discharge: 2018-05-11 | DRG: 603 | Disposition: A | Discharge status: Home / Self Care | Payer: Self-pay | Attending: Internal Medicine | Admitting: Internal Medicine

## 2018-05-05 DIAGNOSIS — F1110 Opioid abuse, uncomplicated: Secondary | ICD-10-CM | POA: Diagnosis present

## 2018-05-05 DIAGNOSIS — R6510 Systemic inflammatory response syndrome (SIRS) of non-infectious origin without acute organ dysfunction: Secondary | ICD-10-CM

## 2018-05-05 DIAGNOSIS — Z880 Allergy status to penicillin: Secondary | ICD-10-CM

## 2018-05-05 DIAGNOSIS — F329 Major depressive disorder, single episode, unspecified: Secondary | ICD-10-CM | POA: Diagnosis present

## 2018-05-05 DIAGNOSIS — J45909 Unspecified asthma, uncomplicated: Secondary | ICD-10-CM | POA: Diagnosis present

## 2018-05-05 DIAGNOSIS — L02414 Cutaneous abscess of left upper limb: Secondary | ICD-10-CM | POA: Diagnosis present

## 2018-05-05 DIAGNOSIS — F17210 Nicotine dependence, cigarettes, uncomplicated: Secondary | ICD-10-CM | POA: Diagnosis present

## 2018-05-05 DIAGNOSIS — E876 Hypokalemia: Secondary | ICD-10-CM | POA: Diagnosis present

## 2018-05-05 DIAGNOSIS — L02419 Cutaneous abscess of limb, unspecified: Secondary | ICD-10-CM | POA: Diagnosis present

## 2018-05-05 DIAGNOSIS — Z79899 Other long term (current) drug therapy: Secondary | ICD-10-CM

## 2018-05-05 DIAGNOSIS — F1510 Other stimulant abuse, uncomplicated: Secondary | ICD-10-CM | POA: Diagnosis present

## 2018-05-05 DIAGNOSIS — D72829 Elevated white blood cell count, unspecified: Secondary | ICD-10-CM | POA: Diagnosis present

## 2018-05-05 DIAGNOSIS — L02213 Cutaneous abscess of chest wall: Principal | ICD-10-CM

## 2018-05-05 HISTORY — PX: INCISION AND DRAINAGE ABSCESS: SHX5864

## 2018-05-05 LAB — CBC WITH DIFFERENTIAL/PLATELET
ABS IMMATURE GRANULOCYTES: 0.18 10*3/uL — AB (ref 0.00–0.07)
Basophils Absolute: 0.1 10*3/uL (ref 0.0–0.1)
Basophils Relative: 0 %
EOS ABS: 0.2 10*3/uL (ref 0.0–0.5)
Eosinophils Relative: 1 %
HCT: 35 % — ABNORMAL LOW (ref 36.0–46.0)
HEMOGLOBIN: 11.5 g/dL — AB (ref 12.0–15.0)
Immature Granulocytes: 1 %
LYMPHS ABS: 4 10*3/uL (ref 0.7–4.0)
Lymphocytes Relative: 24 %
MCH: 29.8 pg (ref 26.0–34.0)
MCHC: 32.9 g/dL (ref 30.0–36.0)
MCV: 90.7 fL (ref 80.0–100.0)
Monocytes Absolute: 1.6 10*3/uL — ABNORMAL HIGH (ref 0.1–1.0)
Monocytes Relative: 10 %
NEUTROS ABS: 10.8 10*3/uL — AB (ref 1.7–7.7)
NRBC: 0 % (ref 0.0–0.2)
Neutrophils Relative %: 64 %
PLATELETS: 216 10*3/uL (ref 150–400)
RBC: 3.86 MIL/uL — ABNORMAL LOW (ref 3.87–5.11)
RDW: 13.7 % (ref 11.5–15.5)
WBC: 16.8 10*3/uL — AB (ref 4.0–10.5)

## 2018-05-05 LAB — I-STAT CG4 LACTIC ACID, ED: Lactic Acid, Venous: 2.13 mmol/L (ref 0.5–1.9)

## 2018-05-05 LAB — BASIC METABOLIC PANEL
Anion gap: 13 (ref 5–15)
BUN: 11 mg/dL (ref 6–20)
CO2: 25 mmol/L (ref 22–32)
Calcium: 8.4 mg/dL — ABNORMAL LOW (ref 8.9–10.3)
Chloride: 96 mmol/L — ABNORMAL LOW (ref 98–111)
Creatinine, Ser: 0.76 mg/dL (ref 0.44–1.00)
GFR calc Af Amer: >60 mL/min (ref >60)
GFR calc non Af Amer: >60 mL/min (ref >60)
GLUCOSE: 128 mg/dL — AB (ref 70–99)
POTASSIUM: 3 mmol/L — AB (ref 3.5–5.1)
SODIUM: 134 mmol/L — AB (ref 135–145)

## 2018-05-05 LAB — HCG, SERUM, QUALITATIVE: Preg, Serum: NEGATIVE

## 2018-05-05 LAB — PROTIME-INR
INR: 0.98
Prothrombin Time: 12.9 seconds (ref 11.4–15.2)

## 2018-05-05 SURGERY — INCISION AND DRAINAGE, ABSCESS
Anesthesia: General | Site: Shoulder | Laterality: Left

## 2018-05-05 MED ORDER — OXYCODONE HCL 5 MG PO TABS
5.0000 mg | ORAL_TABLET | Freq: Once | ORAL | Status: AC | PRN
  Administered 2018-05-05: 5 mg via ORAL

## 2018-05-05 MED ORDER — ONDANSETRON HCL 4 MG/2ML IJ SOLN: 4.0000 mg | Freq: Four times a day (QID) | INTRAMUSCULAR | Status: DC | PRN

## 2018-05-05 MED ORDER — NICOTINE 21 MG/24HR TD PT24
21.0000 mg | MEDICATED_PATCH | Freq: Every day | TRANSDERMAL | Status: DC
  Administered 2018-05-05 – 2018-05-10 (×6): 21 mg via TRANSDERMAL
  Filled 2018-05-05 (×7): qty 1

## 2018-05-05 MED ORDER — MIDAZOLAM HCL 2 MG/2ML IJ SOLN
INTRAMUSCULAR | Status: AC
  Filled 2018-05-05: qty 2

## 2018-05-05 MED ORDER — LIDOCAINE HCL (CARDIAC) PF 100 MG/5ML IV SOSY
PREFILLED_SYRINGE | INTRAVENOUS | Status: DC | PRN
  Administered 2018-05-05: 80 mg via INTRATRACHEAL

## 2018-05-05 MED ORDER — OXYCODONE HCL 5 MG PO TABS
ORAL_TABLET | ORAL | Status: AC
  Filled 2018-05-05: qty 1

## 2018-05-05 MED ORDER — SUCCINYLCHOLINE CHLORIDE 200 MG/10ML IV SOSY
PREFILLED_SYRINGE | INTRAVENOUS | Status: AC
Start: 2018-05-05 — End: ?
  Filled 2018-05-05: qty 10

## 2018-05-05 MED ORDER — SODIUM CHLORIDE 0.9 % IV SOLN
2.0000 g | Freq: Once | INTRAVENOUS | Status: DC
  Filled 2018-05-05: qty 2

## 2018-05-05 MED ORDER — SUCCINYLCHOLINE 20MG/ML (10ML) SYRINGE FOR MEDFUSION PUMP - OPTIME
INTRAMUSCULAR | Status: DC | PRN
  Administered 2018-05-05: 120 mg via INTRAVENOUS

## 2018-05-05 MED ORDER — LACTATED RINGERS IV SOLN
INTRAVENOUS | Status: DC
  Administered 2018-05-06 – 2018-05-10 (×8): via INTRAVENOUS

## 2018-05-05 MED ORDER — POLYETHYLENE GLYCOL 3350 17 G PO PACK
17.0000 g | PACK | Freq: Every day | ORAL | Status: DC | PRN
  Administered 2018-05-07 – 2018-05-10 (×2): 17 g via ORAL
  Filled 2018-05-05 (×2): qty 1

## 2018-05-05 MED ORDER — OXYCODONE-ACETAMINOPHEN 5-325 MG PO TABS
1.0000 | ORAL_TABLET | ORAL | Status: DC | PRN
  Administered 2018-05-05 – 2018-05-08 (×13): 1 via ORAL
  Filled 2018-05-05 (×13): qty 1

## 2018-05-05 MED ORDER — OXYCODONE HCL 5 MG/5ML PO SOLN: 5.0000 mg | Freq: Once | ORAL | Status: AC | PRN

## 2018-05-05 MED ORDER — FENTANYL CITRATE (PF) 250 MCG/5ML IJ SOLN
INTRAMUSCULAR | Status: DC | PRN
  Administered 2018-05-05: 50 ug via INTRAVENOUS
  Administered 2018-05-05 (×3): 100 ug via INTRAVENOUS
  Administered 2018-05-05: 50 ug via INTRAVENOUS

## 2018-05-05 MED ORDER — ONDANSETRON HCL 4 MG/2ML IJ SOLN
INTRAMUSCULAR | Status: DC | PRN
  Administered 2018-05-05: 4 mg via INTRAVENOUS

## 2018-05-05 MED ORDER — FENTANYL CITRATE (PF) 250 MCG/5ML IJ SOLN
INTRAMUSCULAR | Status: AC
  Filled 2018-05-05: qty 5

## 2018-05-05 MED ORDER — LACTATED RINGERS IV SOLN
INTRAVENOUS | Status: DC
  Administered 2018-05-05: 20:00:00 via INTRAVENOUS

## 2018-05-05 MED ORDER — ONDANSETRON HCL 4 MG/2ML IJ SOLN
INTRAMUSCULAR | Status: AC
  Filled 2018-05-05: qty 2

## 2018-05-05 MED ORDER — FENTANYL CITRATE (PF) 100 MCG/2ML IJ SOLN
INTRAMUSCULAR | Status: AC
  Filled 2018-05-05: qty 2

## 2018-05-05 MED ORDER — TRAZODONE HCL 50 MG PO TABS
50.0000 mg | ORAL_TABLET | Freq: Every day | ORAL | Status: DC
  Administered 2018-05-05 – 2018-05-10 (×6): 100 mg via ORAL
  Filled 2018-05-05 (×6): qty 2

## 2018-05-05 MED ORDER — MORPHINE SULFATE (PF) 4 MG/ML IV SOLN
4.0000 mg | Freq: Once | INTRAVENOUS | Status: AC
Start: 2018-05-05 — End: 2018-05-05
  Administered 2018-05-05: 4 mg via INTRAVENOUS
  Filled 2018-05-05: qty 1

## 2018-05-05 MED ORDER — VANCOMYCIN HCL 10 G IV SOLR
1500.0000 mg | Freq: Once | INTRAVENOUS | Status: AC
  Administered 2018-05-05: 1500 mg via INTRAVENOUS
  Filled 2018-05-05: qty 1500

## 2018-05-05 MED ORDER — OXYCODONE-ACETAMINOPHEN 7.5-325 MG PO TABS: 1.0000 | ORAL_TABLET | ORAL | Status: DC | PRN

## 2018-05-05 MED ORDER — CITALOPRAM HYDROBROMIDE 20 MG PO TABS
40.0000 mg | ORAL_TABLET | Freq: Every day | ORAL | Status: DC
  Administered 2018-05-05 – 2018-05-11 (×7): 40 mg via ORAL
  Filled 2018-05-05 (×7): qty 2

## 2018-05-05 MED ORDER — SODIUM CHLORIDE 0.9 % IV BOLUS
1000.0000 mL | Freq: Once | INTRAVENOUS | Status: AC
  Administered 2018-05-05: 1000 mL via INTRAVENOUS

## 2018-05-05 MED ORDER — ENOXAPARIN SODIUM 40 MG/0.4ML ~~LOC~~ SOLN
40.0000 mg | SUBCUTANEOUS | Status: DC
  Filled 2018-05-05 (×3): qty 0.4

## 2018-05-05 MED ORDER — LIDOCAINE 2% (20 MG/ML) 5 ML SYRINGE
INTRAMUSCULAR | Status: AC
  Filled 2018-05-05: qty 5

## 2018-05-05 MED ORDER — VANCOMYCIN HCL IN DEXTROSE 1-5 GM/200ML-% IV SOLN
1000.0000 mg | Freq: Once | INTRAVENOUS | Status: AC
  Administered 2018-05-06: 1000 mg via INTRAVENOUS
  Filled 2018-05-05: qty 200

## 2018-05-05 MED ORDER — PROPOFOL 10 MG/ML IV BOLUS
INTRAVENOUS | Status: AC
  Filled 2018-05-05: qty 20

## 2018-05-05 MED ORDER — HEPARIN SODIUM (PORCINE) 5000 UNIT/ML IJ SOLN: 5000.0000 [IU] | Freq: Three times a day (TID) | INTRAMUSCULAR | Status: DC

## 2018-05-05 MED ORDER — 0.9 % SODIUM CHLORIDE (POUR BTL) OPTIME
TOPICAL | Status: DC | PRN
  Administered 2018-05-05: 1000 mL

## 2018-05-05 MED ORDER — PROPOFOL 10 MG/ML IV BOLUS
INTRAVENOUS | Status: DC | PRN
  Administered 2018-05-05: 200 mg via INTRAVENOUS

## 2018-05-05 MED ORDER — FENTANYL CITRATE (PF) 100 MCG/2ML IJ SOLN
25.0000 ug | INTRAMUSCULAR | Status: DC | PRN
  Administered 2018-05-05 (×3): 50 ug via INTRAVENOUS

## 2018-05-05 MED ORDER — OXYCODONE HCL 5 MG PO TABS
2.5000 mg | ORAL_TABLET | ORAL | Status: DC | PRN
  Administered 2018-05-05 – 2018-05-08 (×14): 2.5 mg via ORAL
  Filled 2018-05-05 (×13): qty 1

## 2018-05-05 SURGICAL SUPPLY — 46 items
BLADE CLIPPER SURG (BLADE) IMPLANT
BLADE SURG 10 STRL SS (BLADE) ×3 IMPLANT
BLADE SURG 15 STRL LF DISP TIS (BLADE) ×1 IMPLANT
BLADE SURG 15 STRL SS (BLADE) ×3
BNDG GAUZE ELAST 4 BULKY (GAUZE/BANDAGES/DRESSINGS) ×3 IMPLANT
CANISTER SUCT 3000ML PPV (MISCELLANEOUS) ×3 IMPLANT
COVER SURGICAL LIGHT HANDLE (MISCELLANEOUS) ×3 IMPLANT
COVER WAND RF STERILE (DRAPES) ×3 IMPLANT
DRAIN PENROSE 1/4X12 LTX STRL (WOUND CARE) ×3 IMPLANT
DRAIN PENROSE 18X1/2 LTX STRL (DRAIN) ×6 IMPLANT
DRAPE LAPAROSCOPIC ABDOMINAL (DRAPES) ×3 IMPLANT
DRAPE LAPAROTOMY 100X72 PEDS (DRAPES) IMPLANT
DRAPE UTILITY XL STRL (DRAPES) ×3 IMPLANT
DRSG PAD ABDOMINAL 8X10 ST (GAUZE/BANDAGES/DRESSINGS) ×6 IMPLANT
ELECT CAUTERY BLADE 6.4 (BLADE) ×3 IMPLANT
ELECT REM PT RETURN 9FT ADLT (ELECTROSURGICAL) ×3
ELECTRODE REM PT RTRN 9FT ADLT (ELECTROSURGICAL) ×1 IMPLANT
GAUZE 4X4 16PLY RFD (DISPOSABLE) ×3 IMPLANT
GAUZE PACKING IODOFORM 1/2 (PACKING) IMPLANT
GAUZE SPONGE 4X4 12PLY STRL (GAUZE/BANDAGES/DRESSINGS) ×3 IMPLANT
GLOVE BIO SURGEON STRL SZ7.5 (GLOVE) ×3 IMPLANT
GLOVE INDICATOR 8.0 STRL GRN (GLOVE) ×3 IMPLANT
GOWN STRL REUS W/ TWL LRG LVL3 (GOWN DISPOSABLE) ×1 IMPLANT
GOWN STRL REUS W/ TWL XL LVL3 (GOWN DISPOSABLE) ×1 IMPLANT
GOWN STRL REUS W/TWL LRG LVL3 (GOWN DISPOSABLE) ×3
GOWN STRL REUS W/TWL XL LVL3 (GOWN DISPOSABLE) ×3
KIT BASIN OR (CUSTOM PROCEDURE TRAY) ×3 IMPLANT
KIT TURNOVER KIT B (KITS) ×3 IMPLANT
NS IRRIG 1000ML POUR BTL (IV SOLUTION) ×3 IMPLANT
PACK LITHOTOMY IV (CUSTOM PROCEDURE TRAY) IMPLANT
PACK SURGICAL SETUP 50X90 (CUSTOM PROCEDURE TRAY) IMPLANT
PAD ARMBOARD 7.5X6 YLW CONV (MISCELLANEOUS) ×3 IMPLANT
PENCIL BUTTON HOLSTER BLD 10FT (ELECTRODE) ×3 IMPLANT
SPONGE LAP 18X18 RF (DISPOSABLE) ×3 IMPLANT
SPONGE LAP 18X18 X RAY DECT (DISPOSABLE) ×3 IMPLANT
SUT SILK 2 0 SH (SUTURE) ×6 IMPLANT
SWAB COLLECTION DEVICE MRSA (MISCELLANEOUS) IMPLANT
SWAB CULTURE ESWAB REG 1ML (MISCELLANEOUS) IMPLANT
TAPE CLOTH SURG 6X10 WHT LF (GAUZE/BANDAGES/DRESSINGS) ×3 IMPLANT
TOWEL OR 17X24 6PK STRL BLUE (TOWEL DISPOSABLE) ×3 IMPLANT
TOWEL OR 17X26 10 PK STRL BLUE (TOWEL DISPOSABLE) ×3 IMPLANT
TUBE ANAEROBIC SPECIMEN COL (MISCELLANEOUS) IMPLANT
TUBE CONNECTING 12'X1/4 (SUCTIONS) ×1
TUBE CONNECTING 12X1/4 (SUCTIONS) ×2 IMPLANT
UNDERPAD 30X30 INCONTINENT (UNDERPADS AND DIAPERS) ×3 IMPLANT
YANKAUER SUCT BULB TIP NO VENT (SUCTIONS) ×3 IMPLANT

## 2018-05-05 NOTE — Anesthesia Procedure Notes (Signed)
Procedure Name: Intubation Date/Time: 05/05/2018 7:58 PM Performed by: Molli HazardGordon, Kiree Dejarnette M, CRNA Pre-anesthesia Checklist: Patient identified, Emergency Drugs available, Suction available and Patient being monitored Patient Re-evaluated:Patient Re-evaluated prior to induction Oxygen Delivery Method: Circle system utilized Preoxygenation: Pre-oxygenation with 100% oxygen Induction Type: IV induction and Rapid sequence Laryngoscope Size: Miller and 2 Grade View: Grade I Tube type: Oral Tube size: 7.5 mm Number of attempts: 1 Airway Equipment and Method: Stylet Placement Confirmation: ETT inserted through vocal cords under direct vision,  positive ETCO2 and breath sounds checked- equal and bilateral Secured at: 23 cm Tube secured with: Tape Dental Injury: Teeth and Oropharynx as per pre-operative assessment

## 2018-05-05 NOTE — H&P (Addendum)
Date: 05/05/2018               Patient Name:  Lori Baker MRN: [id number]  DOB: 02/09/1986 Age / Sex: 32 y.o., female   PCP: Patient, No Pcp Per         Medical Service: Internal Medicine Teaching Service         Attending Physician: Dr. Criselda PeachesMullen    First Contact: Dr. Petra KubaVogel Pager: [phone]  Second Contact: Dr. Delma Officerhundi Pager: [phone]       After Hours (After 5p/  First Contact Pager: [phone]  weekends / holidays): Second Contact Pager: [phone]   Chief Complaint: left shoulder pain   History of Present Illness: Lori Baker is a 32 y/o female with PMHx of polysubstance abuse presents for progressive swelling of left shoulder and chest wall for the last 4 days with associated redness and tenderness. She states it started as a small pimple on her arm that she popped and subsequently developed the redness and swelling. The area has been enlarging since onset. Her pain is worse with any movements. Also complains of associated chills. She denies fevers, chest pain, shortness of breath, nausea, vomiting, other skin lesions, urinary symptoms, or changes in bowel movements.  She endorses a history of abscesses which have required drainage in the past.   Meds:  Current Meds  Medication Sig  . citalopram (CELEXA) 40 MG tablet Take 40 mg by mouth daily.   Marland Kitchen. ibuprofen (ADVIL,MOTRIN) 400 MG tablet Take 400 mg by mouth every 8 (eight) hours as needed (for pain).   . nicotine (NICODERM CQ - DOSED IN MG/24 HOURS) 21 mg/24hr patch Place 21 mg onto the skin daily.   . traZODone (DESYREL) 50 MG tablet Take 50-100 mg by mouth at bedtime.     Allergies: Allergies as of 05/05/2018 - Review Complete 05/05/2018  Allergen Reaction Noted  . Penicillins Hives 07/02/2011   Past Medical History:  Diagnosis Date  . Asthma     Family History:  Family History  Problem Relation Age of Onset  . Hypertension Mother      Social History: lives with her husband; former IV drug user but has not  injected for over a year; endorses smoking meth and snorting heroin currently. No EtOH use   Review of Systems: A complete ROS was negative except as per HPI.   Physical Exam: Blood pressure 128/69, pulse (!) 124, temperature 98.7 F (37.1 C), temperature source Oral, resp. rate (!) 22, height 5\' 7"  (1.702 m), weight 68 kg, SpO2 100 %. General: awake, alert, tearful on exam, appears uncomfortable but NAD HEENT: [place]/AT; conjunctiva clear, moist mucous membranes  Neck: supple, no thyromegaly CV: Tachycardic, regular rhythm; no murmurs, rubs or gallops Pulm: normal respiratory effort, lungs CTA in anterior fields Abd: soft, non-distended, non-tender; active bowel sounds Neuro: A&Ox3; answers questions appropriately and follows commands; no focal deficits MSK: LUE and chest wall with clean dressing overlying 3 penrose drains   Assessment & Plan by Problem: Active Problems:   Abscess of arm  Lori Baker is a 32 y/o female with history of polysubstance abuse who presents for multiple subcutaneous abscesses overlying left upper arm, shoulder and chest wall. Patient was afebrile on arrival. Labs revealed leukocytosis of 16.8 and mildly elevated lactic acid of 2.1. BMP with Mild hypokalemia of 3.0. General surgery was consulted by EDP. She was taken to the OR for incision and drainage and subsequently admitted to IMTS for IV antibiotics and medical management.  1. Abscess of LUE - appreciate general surgery management. She is s/p I&D. Found to have large communicating abscess cavity approx 30 x 30 cm in size of left shoulder, chest wall and upper extremity containing extensive amounts of pus which extended down to muscle but did not involve any muscle - currently on Vanc; will narrow pending wound culture and gram stain - blood cx pending - received several doses of Fentanyl in OR and PACU; will continue Percocet q 4 PRN for pain control  - bowel regimen - Zofran prn nausea   2. Polysubstance  abuse - denies recent IV drug use, but endorses smoking meth a few days ago; snorted heroin this morning (12/12); states she snorts heroin a few times/month. Stopped using IV drug use on her own over a year ago. No recent withdrawal symptoms  recently - COWS protocol   3. Depression - continue home dose of Celexa and Trazodone qhs   Diet: regular DVT ppx: Lovenox CODE: FULL   Dispo: Admit patient to Inpatient with expected length of stay greater than 2 midnights.  SignedBridget Hartshorn, DO 05/05/2018, 8:38 PM  Pager: [phone]

## 2018-05-05 NOTE — Op Note (Signed)
05/05/2018  8:23 PM  PATIENT:  Lori Baker  32 y.o. female  Patient Care Team: Patient, No Pcp Per as PCP - General (General Practice)  PRE-OPERATIVE DIAGNOSIS:  Abscesses of left arm/shoulder/chest wall  POST-OPERATIVE DIAGNOSIS:  Same  PROCEDURE:  Incision and drainage of large communicating abscess of left arm/shoulder/chest wall - extending down to muscle but not involving the muscle  SURGEON:  Stephanie Couphristopher M. Braylon Grenda, MD  ASSISTANT: OR team  ANESTHESIA:   general  COUNTS:  Sponge, needle and instrument counts were reported correct x2 at the conclusion of the operation.  EBL: 50 cc  DRAINS: Multiple penrose drains placed connecting the cavity  SPECIMEN: Abscess - Gram Stain, culuture  COMPLICATIONS: None  FINDINGS: Large communicating abscess cavity of left shoulder/chest wall/upper arm - containing extensive amounts of pus. Multiple counter incisions created and penrose drains placed to facilitate drainage. These extended down to muscle. Total area was approximately 30 x 30 cm in size  DISPOSITION: PACU in satisfactory condition  INDICATION: Lori Baker is an 32 y.o. female with prior hx of IVDA but states she has not injected in many months whom presents for evaluation of swelling/tenderness/abscess of skin overlying left upper arm/shoulder/chest wall. She denies fever/chills. Denies hx of abscesses but states her husband has had MRSA before. She reports smoking meth recently as well as snorting heroin but denies any injection.   She was found to have a large abscess of the left upper arm/shoulder/chest wall. Recommendations were made for incision and drainage for source control. Please refer to H&P for details regarding this discussion  DESCRIPTION: The patient was identified in preop holding and taken to the OR where they were placed on the operating room table and SCDs were placed. General endotracheal anesthesia was induced without difficulty. The patient was  then prepped and draped in the usual sterile fashion. A surgical timeout was performed indicating the correct patient, procedure, positioning and need for preoperative antibiotics. Areas of fluctuance overlying upper left arm/shoulder/anterior chest wall were made. Copious foul smelling pus freely flowed and was sent for culture. All abscesses were drained and most communicated with one another. Penrose drains were placed connecting these and secured with silk suture. The wounds were then irrigated with saline. Hemostasis was achieved with electrocautery. The wounds were packed with moist kerlex, covered with 4x4s and abd and secured with tape. The patient was then awakened from general anesthesia, extubated, and transferred to a stretcher and transported to PACU in satisfactory condition.

## 2018-05-05 NOTE — Consult Note (Signed)
CC: Consult by Dr. Charm Barges in ED - Left shoulder/chest wall subcutaneous abscesses  HPI: Lori Baker is an 32 y.o. female with prior hx of IVDA but states she has not injected in many months whom presents for evaluation of swelling/tenderness/abscess of skin overlying left upper arm/shoulder/chest wall. She denies fever/chills. Denies hx of abscesses but states her husband has had MRSA before. She reports smoking meth recently as well as snorting heroin but denies any injection.  Past Medical History:  Diagnosis Date  . Asthma     Past Surgical History:  Procedure Laterality Date  . MOUTH SURGERY      Family History  Problem Relation Age of Onset  . Hypertension Mother     Social:  reports that she has been smoking. She has been smoking about 0.25 packs per day. She has never used smokeless tobacco. She reports current drug use. Drugs: IV, Methamphetamines, and Cocaine. She reports that she does not drink alcohol.  Allergies:  Allergies  Allergen Reactions  . Penicillins Hives    Has patient had a PCN reaction causing immediate rash, facial/tongue/throat swelling, SOB or lightheadedness with hypotension: Yes Has patient had a PCN reaction causing severe rash involving mucus membranes or skin necrosis: No Has patient had a PCN reaction that required hospitalization: No Has patient had a PCN reaction occurring within the last 10 years: No If all of the above answers are "NO", then may proceed with Cephalosporin use.     Medications: I have reviewed the patient's current medications.  Results for orders placed or performed during the hospital encounter of 05/05/18 (from the past 48 hour(s))  CBC with Differential     Status: Abnormal   Collection Time: 05/05/18  5:44 PM  Result Value Ref Range   WBC 16.8 (H) 4.0 - 10.5 K/uL   RBC 3.86 (L) 3.87 - 5.11 MIL/uL   Hemoglobin 11.5 (L) 12.0 - 15.0 g/dL   HCT 16.1 (L) 09.6 - 04.5 %   MCV 90.7 80.0 - 100.0 fL   MCH 29.8 26.0 -  34.0 pg   MCHC 32.9 30.0 - 36.0 g/dL   RDW 40.9 81.1 - 91.4 %   Platelets 216 150 - 400 K/uL   nRBC 0.0 0.0 - 0.2 %   Neutrophils Relative % 64 %   Neutro Abs 10.8 (H) 1.7 - 7.7 K/uL   Lymphocytes Relative 24 %   Lymphs Abs 4.0 0.7 - 4.0 K/uL   Monocytes Relative 10 %   Monocytes Absolute 1.6 (H) 0.1 - 1.0 K/uL   Eosinophils Relative 1 %   Eosinophils Absolute 0.2 0.0 - 0.5 K/uL   Basophils Relative 0 %   Basophils Absolute 0.1 0.0 - 0.1 K/uL   Immature Granulocytes 1 %   Abs Immature Granulocytes 0.18 (H) 0.00 - 0.07 K/uL    Comment: Performed at [facility], [address] [place], [place] [address]  I-Stat CG4 Lactic Acid, ED     Status: Abnormal   Collection Time: 05/05/18  6:23 PM  Result Value Ref Range   Lactic Acid, Venous 2.13 (HH) 0.5 - 1.9 mmol/L   Comment NOTIFIED PHYSICIAN     No results found.  ROS - all of the below systems have been reviewed with the patient and positives are indicated with bold text General: chills, fever or night sweats Eyes: blurry vision or double vision ENT: epistaxis or sore throat Allergy/Immunology: itchy/watery eyes or nasal congestion Hematologic/Lymphatic: bleeding problems, blood clots or swollen lymph  nodes Endocrine: temperature intolerance or unexpected weight changes Breast: new or changing breast lumps or nipple discharge Resp: cough, shortness of breath, or wheezing CV: chest pain or dyspnea on exertion GI: as per HPI GU: dysuria, trouble voiding, or hematuria MSK: joint pain or joint stiffness Neuro: TIA or stroke symptoms Derm: pruritus and skin lesion changes Psych: anxiety and depression  PE Blood pressure 128/69, pulse (!) 124, temperature 98.7 F (37.1 C), temperature source Oral, resp. rate (!) 22, height 5\' 7"  (1.702 m), weight 68 kg, SpO2 100 %. Constitutional: NAD; conversant; no deformities Eyes: Moist conjunctiva; no lid lag; anicteric; PERRL Neck: Trachea midline; no thyromegaly Lungs: Normal  respiratory effort; no tactile fremitus CV: RRR; no palpable thrills; no pitting edema GI: Abd soft, NT/ND; no palpable hepatosplenomegaly MSK: Normal gait; no clubbing/cyanosis; skin overlying left upper arm, shoulder and chest wall with multiple areas of fluctuance and abscess - at least 4 - may be confluent. Blanching erythema extends over these collections and there are streaks of erythema down the left upper arm. Psychiatric: Appropriate affect; alert and oriented x3 Lymphatic: No palpable cervical or axillary lymphadenopathy  Results for orders placed or performed during the hospital encounter of 05/05/18 (from the past 48 hour(s))  CBC with Differential     Status: Abnormal   Collection Time: 05/05/18  5:44 PM  Result Value Ref Range   WBC 16.8 (H) 4.0 - 10.5 K/uL   RBC 3.86 (L) 3.87 - 5.11 MIL/uL   Hemoglobin 11.5 (L) 12.0 - 15.0 g/dL   HCT 16.135.0 (L) 09.636.0 - 04.546.0 %   MCV 90.7 80.0 - 100.0 fL   MCH 29.8 26.0 - 34.0 pg   MCHC 32.9 30.0 - 36.0 g/dL   RDW 40.913.7 81.111.5 - 91.415.5 %   Platelets 216 150 - 400 K/uL   nRBC 0.0 0.0 - 0.2 %   Neutrophils Relative % 64 %   Neutro Abs 10.8 (H) 1.7 - 7.7 K/uL   Lymphocytes Relative 24 %   Lymphs Abs 4.0 0.7 - 4.0 K/uL   Monocytes Relative 10 %   Monocytes Absolute 1.6 (H) 0.1 - 1.0 K/uL   Eosinophils Relative 1 %   Eosinophils Absolute 0.2 0.0 - 0.5 K/uL   Basophils Relative 0 %   Basophils Absolute 0.1 0.0 - 0.1 K/uL   Immature Granulocytes 1 %   Abs Immature Granulocytes 0.18 (H) 0.00 - 0.07 K/uL    Comment: Performed at [facility], [address] [place], [place] [address]  I-Stat CG4 Lactic Acid, ED     Status: Abnormal   Collection Time: 05/05/18  6:23 PM  Result Value Ref Range   Lactic Acid, Venous 2.13 (HH) 0.5 - 1.9 mmol/L   Comment NOTIFIED PHYSICIAN    A/P: Lori ChenWendy G Baker is an 32 y.o. female with multiple subcutaneous abscesses overlying left upper arm, shoulder and chest wall  -Admit to medicine service as she is at  high risk for poly substance withdrawal; broad spec IV abx -Planning to proceed with incision + drainage of abscesses in OR, possible debridement - left upper arm/shoulder/chest wall. -The pertinent anatomy and physiology was discussed at length with the patient. The pathophysiology of abscesses was discussed at length as well. -We discussed options moving forward including IV abx and incision and drainage. Given her tachycardia and size of these abscesses, I have recommended IV abx and incision and drainage of the abscesses. We discussed surgical procedure and techniques including potential for large open wounds  as well as possible penrose drain placement. We discussed the material risks (including, but not limited to, pain, bleeding, infection, need for blood transfusion, damage to surrounding structures- blood vessels/nerves, need for additional drainage procedures/debridement, recurrence, pneumonia, heart attack, stroke, death) benefits and alternatives to surgery were discussed at length. The patient's questions were answered to her satisfaction, she voiced understanding and elected to proceed with surgery. Additionally, we discussed typical postoperative expectations and the recovery process.  Stephanie Coup. Cliffton Asters, M.D. [facility]

## 2018-05-05 NOTE — Transfer of Care (Signed)
Immediate Anesthesia Transfer of Care Note  Patient: Lori Baker  Procedure(s) Performed: INCISION AND DRAINAGE LEFT SHOULDER ABSCESS AND DRAIN PLACEMENT (Left Shoulder)  Patient Location: PACU  Anesthesia Type:General  Level of Consciousness: awake, alert  and oriented  Airway & Oxygen Therapy: Patient Spontanous Breathing  Post-op Assessment: Report given to RN and Post -op Vital signs reviewed and stable  Post vital signs: Reviewed and stable  Last Vitals:  Vitals Value Taken Time  BP 121/69 05/05/2018  8:45 PM  Temp    Pulse 104 05/05/2018  8:47 PM  Resp    SpO2 100 % 05/05/2018  8:47 PM  Vitals shown include unvalidated device data.  Last Pain:  Vitals:   05/05/18 1719  TempSrc: Oral  PainSc:          Complications: No apparent anesthesia complications

## 2018-05-05 NOTE — ED Triage Notes (Signed)
Pt states she has abscess to left shoulder for 4 days. Denies IV drug use, states she has used heroin today. Large masses noted to left shoulder

## 2018-05-05 NOTE — Anesthesia Preprocedure Evaluation (Signed)
Anesthesia Evaluation  Patient identified by MRN, date of birth, ID band Patient awake    Reviewed: Allergy & Precautions, H&P , NPO status , Patient's Chart, lab work & pertinent test results  Airway Mallampati: II   Neck ROM: full    Dental   Pulmonary asthma , Current Smoker,    breath sounds clear to auscultation       Cardiovascular negative cardio ROS   Rhythm:regular Rate:Normal     Neuro/Psych    GI/Hepatic   Endo/Other    Renal/GU      Musculoskeletal   Abdominal   Peds  Hematology   Anesthesia Other Findings   Reproductive/Obstetrics                             Anesthesia Physical Anesthesia Plan  ASA: II  Anesthesia Plan: General   Post-op Pain Management:    Induction: Intravenous  PONV Risk Score and Plan: 2 and Ondansetron, Dexamethasone, Midazolam and Treatment may vary due to age or medical condition  Airway Management Planned: Oral ETT  Additional Equipment:   Intra-op Plan:   Post-operative Plan: Extubation in OR  Informed Consent: I have reviewed the patients History and Physical, chart, labs and discussed the procedure including the risks, benefits and alternatives for the proposed anesthesia with the patient or authorized representative who has indicated his/her understanding and acceptance.       Plan Discussed with: CRNA, Anesthesiologist and Surgeon  Anesthesia Plan Comments:         Anesthesia Quick Evaluation  

## 2018-05-05 NOTE — ED Provider Notes (Signed)
[facility] Provider Note   CSN: [id number] Arrival date & time: 05/05/18  1708     History   Chief Complaint No chief complaint on file.   HPI Lori Baker is a 32 y.o. female.  She is presenting with 4 days of swelling and pain to her left shoulder.  She said it started off as a small pimple that she popped and that is been gradually swelling and red and painful since then.  She has had a history of some abscesses that required drainage in the past.  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  She thinks she might of had a history of hep C.  She denies being HIV positive.  She says she is having difficulty moving her shoulder secondary to the pain.  She is not sure if there is been a fever.  She otherwise has no nausea or vomiting no chest pain no shortness of breath.  The history is provided by the patient.  Abscess  Location:  Torso and shoulder/arm Shoulder/arm abscess location:  L shoulder Abscess quality: fluctuance, induration, painful, redness and warmth   Progression:  Worsening Pain details:    Quality:  Dull   Severity:  Moderate   Timing:  Constant   Progression:  Worsening Chronicity:  New Context: not immunosuppression and not injected drug use   Relieved by:  None tried Worsened by:  Nothing Ineffective treatments:  None tried Associated symptoms: no fever, no headaches, no nausea and no vomiting   Risk factors: prior abscess     Past Medical History:  Diagnosis Date  . Asthma     There are no active problems to display for this patient.   Past Surgical History:  Procedure Laterality Date  . MOUTH SURGERY       OB History    Gravida  2   Para  1   Term  1   Preterm      AB      Living  1     SAB      TAB      Ectopic      Multiple      Live Births               Home Medications    Prior to Admission medications   Medication Sig Start Date End Date Taking? Authorizing Provider    multivitamin (BARIATRIC VIT W/EXTRA C) CHEW Chew 1 tablet by mouth daily.    [provider]    Family History Family History  Problem Relation Age of Onset  . Hypertension Mother     Social History Social History   Tobacco Use  . Smoking status: Current Every Day Smoker    Packs/day: 0.25  . Smokeless tobacco: Never Used  Substance Use Topics  . Alcohol use: No  . Drug use: Yes    Types: IV, Methamphetamines, Cocaine    Comment: last use 12/03/17     Allergies   Penicillins   Review of Systems Review of Systems  Constitutional: Negative for fever.  HENT: Negative for sore throat.   Eyes: Negative for visual disturbance.  Respiratory: Negative for shortness of breath.   Cardiovascular: Negative for chest pain.  Gastrointestinal: Negative for abdominal pain, nausea and vomiting.  Genitourinary: Negative for dysuria.  Skin: Positive for wound. Negative for rash.  Neurological: Negative for headaches.     Physical Exam Updated Vital Signs BP 137/86 (BP Location: Right Arm)  Pulse (!) 135   Temp 98.7 F (37.1 C) (Oral)   Resp (!) 22   Ht 5\' 7"  (1.702 m)   Wt 68 kg   SpO2 100%   BMI 23.49 kg/m   Physical Exam Vitals signs and nursing note reviewed.  Constitutional:      General: She is not in acute distress.    Appearance: She is well-developed.  HENT:     Head: Normocephalic and atraumatic.  Eyes:     Conjunctiva/sclera: Conjunctivae normal.  Neck:     Musculoskeletal: Normal range of motion and neck supple. Muscular tenderness (left lateral) present.  Cardiovascular:     Rate and Rhythm: Regular rhythm. Tachycardia present.     Heart sounds: No murmur.  Pulmonary:     Effort: Pulmonary effort is normal. No respiratory distress.     Breath sounds: Normal breath sounds.  Abdominal:     Palpations: Abdomen is soft.     Tenderness: There is no abdominal tenderness.  Musculoskeletal:        General: Swelling and tenderness present.      Right lower leg: No edema.     Left lower leg: No edema.  Skin:    General: Skin is warm and dry.     Capillary Refill: Capillary refill takes less than 2 seconds.     Findings: Erythema present.     Comments: She is a large multilobulated mass over her anterior left shoulder with erythema fluctuance and tenderness.  Range of motion of her shoulder causes her pain.  There is no active drainage.  Neurological:     General: No focal deficit present.     Mental Status: She is alert.     Sensory: No sensory deficit.     Motor: No weakness.     Gait: Gait normal.  Psychiatric:        Mood and Affect: Mood normal.        ED Treatments / Results  Labs (all labs ordered are listed, but only abnormal results are displayed) Labs Reviewed  CBC WITH DIFFERENTIAL/PLATELET - Abnormal; Notable for the following components:      Result Value   WBC 16.8 (*)    RBC 3.86 (*)    Hemoglobin 11.5 (*)    HCT 35.0 (*)    Neutro Abs 10.8 (*)    Monocytes Absolute 1.6 (*)    Abs Immature Granulocytes 0.18 (*)    All other components within normal limits  BASIC METABOLIC PANEL - Abnormal; Notable for the following components:   Sodium 134 (*)    Potassium 3.0 (*)    Chloride 96 (*)    Glucose, Bld 128 (*)    Calcium 8.4 (*)    All other components within normal limits  I-STAT CG4 LACTIC ACID, ED - Abnormal; Notable for the following components:   Lactic Acid, Venous 2.13 (*)    All other components within normal limits  CULTURE, BLOOD (ROUTINE X 2)  CULTURE, BLOOD (ROUTINE X 2)  PROTIME-INR  I-STAT BETA HCG BLOOD, ED ([facility], [facility], [facility] ONLY)    EKG None  Radiology No results found.  Procedures Procedures (including critical care time)  Medications Ordered in ED Medications  sodium chloride 0.9 % bolus 1,000 mL (1,000 mLs Intravenous New Bag/Given 05/05/18 1822)  vancomycin (VANCOCIN) 1,500 mg in sodium chloride 0.9 % 500 mL IVPB (has no administration in time range)  meropenem (MERREM)  2 g in sodium chloride 0.9 % 100 mL IVPB (has  no administration in time range)  morphine 4 MG/ML injection 4 mg (4 mg Intravenous Given 05/05/18 1822)     Initial Impression / Assessment and Plan / ED Course  I have reviewed the triage vital signs and the nursing notes.  Pertinent labs & imaging results that were available during my care of the patient were reviewed by me and considered in my medical decision making (see chart for details).  Clinical Course as of May 06 1041  Thu May 05, 2018  1843 Patient was seen by Dr. Cliffton Asters from general surgery.  He is discussing with the patient about going to the operating room for I&D of this.  Antibiotics have been ordered.  Patient is to be n.p.o.  Have added on a PT/INR and a beta hCG.  Dr. Cliffton Asters is asked if the patient could be admitted to medical service and she has no primary care doctor so have paged the unassigned team.   [MB]  1857 Discussed with the internal medicine team who will admit the patient to their service.   [MB]    Clinical Course User Index [MB] Terrilee Files, MD      Final Clinical Impressions(s) / ED Diagnoses   Final diagnoses:  Abscess of chest wall  SIRS (systemic inflammatory response syndrome) [facility])    ED Discharge Orders    None       Terrilee Files, MD 05/06/18 1042

## 2018-05-05 NOTE — ED Notes (Signed)
Vanc sent with pt, report called to OR RN. Consents signed

## 2018-05-06 ENCOUNTER — Other Ambulatory Visit: Payer: Self-pay

## 2018-05-06 ENCOUNTER — Encounter (HOSPITAL_COMMUNITY): Payer: Self-pay | Admitting: Surgery

## 2018-05-06 DIAGNOSIS — L02414 Cutaneous abscess of left upper limb: Secondary | ICD-10-CM

## 2018-05-06 DIAGNOSIS — Z978 Presence of other specified devices: Secondary | ICD-10-CM

## 2018-05-06 DIAGNOSIS — L02213 Cutaneous abscess of chest wall: Principal | ICD-10-CM

## 2018-05-06 DIAGNOSIS — F1910 Other psychoactive substance abuse, uncomplicated: Secondary | ICD-10-CM

## 2018-05-06 LAB — COMPREHENSIVE METABOLIC PANEL
ALBUMIN: 2.1 g/dL — AB (ref 3.5–5.0)
ALT: 94 U/L — ABNORMAL HIGH (ref 0–44)
AST: 31 U/L (ref 15–41)
Alkaline Phosphatase: 122 U/L (ref 38–126)
Anion gap: 11 (ref 5–15)
BUN: 9 mg/dL (ref 6–20)
CHLORIDE: 98 mmol/L (ref 98–111)
CO2: 27 mmol/L (ref 22–32)
Calcium: 7.8 mg/dL — ABNORMAL LOW (ref 8.9–10.3)
Creatinine, Ser: 0.84 mg/dL (ref 0.44–1.00)
GFR calc Af Amer: >60 mL/min (ref >60)
GFR calc non Af Amer: >60 mL/min (ref >60)
Glucose, Bld: 124 mg/dL — ABNORMAL HIGH (ref 70–99)
Potassium: 3.1 mmol/L — ABNORMAL LOW (ref 3.5–5.1)
Sodium: 136 mmol/L (ref 135–145)
Total Bilirubin: 1 mg/dL (ref 0.3–1.2)
Total Protein: 5.6 g/dL — ABNORMAL LOW (ref 6.5–8.1)

## 2018-05-06 LAB — CBC
HCT: 30.2 % — ABNORMAL LOW (ref 36.0–46.0)
Hemoglobin: 9.8 g/dL — ABNORMAL LOW (ref 12.0–15.0)
MCH: 29.6 pg (ref 26.0–34.0)
MCHC: 32.5 g/dL (ref 30.0–36.0)
MCV: 91.2 fL (ref 80.0–100.0)
Platelets: 204 10*3/uL (ref 150–400)
RBC: 3.31 MIL/uL — ABNORMAL LOW (ref 3.87–5.11)
RDW: 13.6 % (ref 11.5–15.5)
WBC: 12.7 10*3/uL — ABNORMAL HIGH (ref 4.0–10.5)
nRBC: 0 % (ref 0.0–0.2)

## 2018-05-06 LAB — HIV ANTIBODY (ROUTINE TESTING W REFLEX): HIV Screen 4th Generation wRfx: NONREACTIVE

## 2018-05-06 MED ORDER — POTASSIUM CHLORIDE CRYS ER 20 MEQ PO TBCR
40.0000 meq | EXTENDED_RELEASE_TABLET | Freq: Two times a day (BID) | ORAL | Status: AC
  Administered 2018-05-06 (×2): 40 meq via ORAL
  Filled 2018-05-06 (×2): qty 2

## 2018-05-06 MED ORDER — IBUPROFEN 400 MG PO TABS
400.0000 mg | ORAL_TABLET | Freq: Four times a day (QID) | ORAL | Status: DC | PRN
  Administered 2018-05-06 – 2018-05-08 (×2): 400 mg via ORAL
  Filled 2018-05-06 (×2): qty 1

## 2018-05-06 MED ORDER — SODIUM CHLORIDE 0.9 % IV SOLN
2.0000 g | Freq: Three times a day (TID) | INTRAVENOUS | Status: DC
  Administered 2018-05-06 – 2018-05-08 (×7): 2 g via INTRAVENOUS
  Filled 2018-05-06 (×8): qty 2

## 2018-05-06 NOTE — Plan of Care (Signed)
  Problem: Clinical Measurements: Goal: Postoperative complications will be avoided or minimized Outcome: Progressing   Problem: Skin Integrity: Goal: Demonstration of wound healing without infection will improve Outcome: Progressing   Problem: Skin Integrity: Goal: Demonstration of wound healing without infection will improve Outcome: Progressing

## 2018-05-06 NOTE — Progress Notes (Addendum)
Pharmacy Antibiotic Note  Danae ChenWendy G Godbey is a 32 y.o. female admitted on 05/05/2018 with LUE large Purulent abscess s/p I&D 12/12.  Pharmacy has been consulted for Cefepime dosing.  ID: LUE large Purulent abscess s/p I&D 12/12.Tmax 100. WBC 12.7. Scr WNL.  Cefepime 12/13>>  12/12 BC x 2>> 12/12: abscess: polymicrobial  Plan: Cefepime 2g IV q 8hrs    Height: 5\' 7"  (170.2 cm) Weight: 150 lb 8 oz (68.3 kg) IBW/kg (Calculated) : 61.6  Temp (24hrs), Avg:98.8 F (37.1 C), Min:98.1 F (36.7 C), Max:100 F (37.8 C)  Recent Labs  Lab 05/05/18 1744 05/05/18 1823 05/06/18 0234  WBC 16.8*  --  12.7*  CREATININE 0.76  --  0.84  LATICACIDVEN  --  2.13*  --     Estimated Creatinine Clearance: 95.2 mL/min (by C-G formula based on SCr of 0.84 mg/dL).    Allergies  Allergen Reactions  . Penicillins Hives    Has patient had a PCN reaction causing immediate rash, facial/tongue/throat swelling, SOB or lightheadedness with hypotension: Yes Has patient had a PCN reaction causing severe rash involving mucus membranes or skin necrosis: No Has patient had a PCN reaction that required hospitalization: No Has patient had a PCN reaction occurring within the last 10 years: No If all of the above answers are "NO", then may proceed with Cephalosporin use.      Crystal S. Merilynn Finlandobertson, PharmD, BCPS Clinical Staff Pharmacist Misty StanleyRobertson, Crystal Stillinger 05/06/2018 10:21 AM   Addum:  Start vancomycin 1250 mg IV x 1 then 750 mg IV q8 hours

## 2018-05-06 NOTE — Progress Notes (Signed)
[facility] Progress Note  1 Day Post-Op  Subjective: CC-  Patient sore/tired this morning. States that her nurse had to change dressing last night due to saturation.  States that she lives with her husband who will be able to help with dressing changes.  Objective: Vital signs in last 24 hours: Temp:  [98.1 F (36.7 C)-100 F (37.8 C)] 98.7 F (37.1 C) (12/13 0550) Pulse Rate:  [102-135] 102 (12/13 0550) Resp:  [12-22] 18 (12/13 0550) BP: (104-137)/(57-86) 112/63 (12/13 0550) SpO2:  [93 %-100 %] 93 % (12/13 0550) Weight:  [63.5 kg-68.3 kg] 68.3 kg (12/12 2213) Last BM Date: 05/05/18  Intake/Output from previous day: 12/12 0701 - 12/13 0700 In: 1173 [P.O.:360; I.V.:690; IV Piggyback:123.1] Out: 20 [Blood:20] Intake/Output this shift: No intake/output data recorded.  PE: Gen:  Alert, NAD HEENT: EOM's intact, pupils equal and round Pulm:  effort normal Abd: Soft, NT/ND Psych: A&Ox3  Skin: warm and dry LUE:      Lab Results:  Recent Labs    05/05/18 1744 05/06/18 0234  WBC 16.8* 12.7*  HGB 11.5* 9.8*  HCT 35.0* 30.2*  PLT 216 204   BMET Recent Labs    05/05/18 1744 05/06/18 0234  NA 134* 136  K 3.0* 3.1*  CL 96* 98  CO2 25 27  GLUCOSE 128* 124*  BUN 11 9  CREATININE 0.76 0.84  CALCIUM 8.4* 7.8*   PT/INR Recent Labs    05/05/18 1838  LABPROT 12.9  INR 0.98   CMP     Component Value Date/Time   NA 136 05/06/2018 0234   K 3.1 (L) 05/06/2018 0234   CL 98 05/06/2018 0234   CO2 27 05/06/2018 0234   GLUCOSE 124 (H) 05/06/2018 0234   BUN 9 05/06/2018 0234   CREATININE 0.84 05/06/2018 0234   CALCIUM 7.8 (L) 05/06/2018 0234   PROT 5.6 (L) 05/06/2018 0234   ALBUMIN 2.1 (L) 05/06/2018 0234   AST 31 05/06/2018 0234   ALT 94 (H) 05/06/2018 0234   ALKPHOS 122 05/06/2018 0234   BILITOT 1.0 05/06/2018 0234   GFRNONAA >60 05/06/2018 0234   GFRAA >60 05/06/2018 0234   Lipase  No results found for:  LIPASE     Studies/Results: No results found.  Anti-infectives: Anti-infectives (From admission, onward)   Start     Dose/Rate Route Frequency Ordered Stop   05/06/18 0600  vancomycin (VANCOCIN) IVPB 1000 mg/200 mL premix     1,000 mg 200 mL/hr over 60 Minutes Intravenous  Once 05/05/18 2213 05/06/18 0623   05/05/18 1900  vancomycin (VANCOCIN) 1,500 mg in sodium chloride 0.9 % 500 mL IVPB     1,500 mg 250 mL/hr over 120 Minutes Intravenous  Once 05/05/18 1831 05/05/18 2003   05/05/18 1900  meropenem (MERREM) 2 g in sodium chloride 0.9 % 100 mL IVPB     2 g 200 mL/hr over 30 Minutes Intravenous  Once 05/05/18 1843         Assessment/Plan Polysubstance abuse Depression  Abscesses of left arm/shoulder/chest wall S/p I&D of large communicating abscess of left arm/shoulder/chest wall 12/12 Dr. Cliffton Asters - extending down to muscle but not involving the muscle - POD 1 - cx pending  ID - vancomycin 12/12>>12/13, merrem 12/12>> FEN - reg diet VTE - SCDs, lovenox Foley - none  Plan - Continue IV abx and follow culture. Plan for once daily dressing changes (loosely pack 3 incisions with saline dampened kerlex and apply dry ABD on top); penrose drains to  remain in place.   LOS: 1 day    Franne FortsBrooke A Taraji Mungo , [facility] 05/06/2018, 8:30 AM Pager: [phone] Mon 7:00 am -11:30 AM Tues-Fri 7:00 am-4:30 pm Sat-Sun 7:00 am-11:30 am

## 2018-05-06 NOTE — Progress Notes (Signed)
Pt refused Lovenox and removed SCD's last night. She understands that Lovenox and SCD's are used to prevent blood clots and understands the potentially serious consequences of a blood clot. She says she doesn't need them because she "moves around a lot. "

## 2018-05-06 NOTE — Care Management Note (Signed)
Case Management Note  Patient Details  Name: Lori Baker MRN: [id number] Date of Birth: 08/03/1985  Subjective/Objective:                    Action/Plan:  HX polysubstance abuse, await culture. Can provide MATCH letter for antibiotics if PO.  Expected Discharge Date:  05/03/18               Expected Discharge Plan:  Home/Self Care  In-House Referral:     Discharge planning Services  CM Consult, Medication Assistance, [facility]  Post Acute Care Choice:    Choice offered to:     DME Arranged:    DME Agency:     HH Arranged:    HH Agency:     Status of Service:  In process, will continue to follow  If discussed at Long Length of Stay Meetings, dates discussed:    Additional Comments:  [name], Abdulhadi Stopa Marie, RN 05/06/2018, 12:30 PM

## 2018-05-06 NOTE — Progress Notes (Signed)
   Subjective: Surgery last night. Pain is much better this morning. Very hungry, tolerated breakfast well.   Objective:  Vital signs in last 24 hours: Vitals:   05/05/18 2213 05/06/18 0210 05/06/18 0550 05/06/18 1021  BP: (!) 121/57 104/60 112/63 113/63  Pulse: (!) 121 (!) 118 (!) 102 (!) 106  Resp: 18 19 18 18   Temp: 100 F (37.8 C) 99.2 F (37.3 C) 98.7 F (37.1 C) 98.3 F (36.8 C)  TempSrc: Oral Oral Oral Oral  SpO2: 100% 98% 93% 97%  Weight: 68.3 kg     Height: 5\' 7"  (1.702 m)      Gen: sitting up in bed Card: tachycardic, regular rhythm, no murmurs, rubs, or gallops Pulm: CTAB Abd: soft, NT, ND MSK: right shoulder packing in place, bandage is clean and dry, ROM 45 degrees abduction and forward flexion.   Micro: Wound culture: +GPC, +GPR, +GNR BCx: NG<24h  Abx:  Vanco (12/12 - present) Cefepime (12/13 - present)  Assessment/Plan:  Active Problems:   Abscess of arm   Abscess of chest wall   Ms. Sundberg is a 32 y/o female with history of polysubstance abuse who presents for multiple subcutaneous abscesses overlying left upper arm, shoulder and chest wall. Patient was afebrile on arrival. Labs revealed leukocytosis of 16.8 and mildly elevated lactic acid of 2.1. s/p surgical I&D   1. LUE/chest wall abscess: s/p surgical I&D. Penrose drains in place. Pain well controlled. ROM improving - continue vanco and cefepime, can narrow once culture results are finalized  - f/u BCx - percocet for pain   2. Polysubstance abuse: COWS protocol   DVT ppx: refusing lovenox and SCDs. Encourage ambulation   Dispo: Anticipated discharge in approximately 1 day(s) pending final culture results    Ali LoweVogel, Cartez Mogle S, MD 05/06/2018, 11:14 AM Pager: [phone]

## 2018-05-07 LAB — CBC
HCT: 29.1 % — ABNORMAL LOW (ref 36.0–46.0)
Hemoglobin: 9 g/dL — ABNORMAL LOW (ref 12.0–15.0)
MCH: 29.5 pg (ref 26.0–34.0)
MCHC: 30.9 g/dL (ref 30.0–36.0)
MCV: 95.4 fL (ref 80.0–100.0)
Platelets: 230 10*3/uL (ref 150–400)
RBC: 3.05 MIL/uL — AB (ref 3.87–5.11)
RDW: 14 % (ref 11.5–15.5)
WBC: 7.9 10*3/uL (ref 4.0–10.5)
nRBC: 0 % (ref 0.0–0.2)

## 2018-05-07 MED ORDER — VANCOMYCIN HCL 10 G IV SOLR
1250.0000 mg | Freq: Once | INTRAVENOUS | Status: AC
  Administered 2018-05-07: 1250 mg via INTRAVENOUS
  Filled 2018-05-07: qty 1250

## 2018-05-07 MED ORDER — VANCOMYCIN HCL IN DEXTROSE 750-5 MG/150ML-% IV SOLN
750.0000 mg | Freq: Three times a day (TID) | INTRAVENOUS | Status: DC
  Administered 2018-05-07 – 2018-05-08 (×3): 750 mg via INTRAVENOUS
  Filled 2018-05-07 (×3): qty 150

## 2018-05-07 NOTE — Progress Notes (Signed)
[facility] Progress Note  2 Days Post-Op  Subjective: CC:  No new complaints. Left shoulder pain stable, worse with dressing changes. Denies fevers/chills. Tolerating PO. Mobilizing.   Objective: Vital signs in last 24 hours: Temp:  [97.4 F (36.3 C)-98.7 F (37.1 C)] 97.4 F (36.3 C) (12/14 0437) Pulse Rate:  [76-114] 76 (12/14 0437) Resp:  [18-19] 19 (12/14 0437) BP: (109-117)/(63-71) 113/70 (12/14 0437) SpO2:  [94 %-97 %] 96 % (12/14 0437) Last BM Date: 05/05/18  Intake/Output from previous day: 12/13 0701 - 12/14 0700 In: 2255.6 [P.O.:1920; I.V.:235.6; IV Piggyback:100] Out: -  Intake/Output this shift: No intake/output data recorded.  PE: Gen:  Alert, NAD HEENT: EOM's intact, pupils equal and round Pulm:  effort normal Abd: Soft, NT/ND Psych: A&Ox3  Skin: warm and dry  LUE: dressing clean and dry, I will come perform dressing change with RN and evaluate wound around 1000 after pre-medication with PO pain meds.   Lab Results:  Recent Labs    05/06/18 0234 05/07/18 0432  WBC 12.7* 7.9  HGB 9.8* 9.0*  HCT 30.2* 29.1*  PLT 204 230   BMET Recent Labs    05/05/18 1744 05/06/18 0234  NA 134* 136  K 3.0* 3.1*  CL 96* 98  CO2 25 27  GLUCOSE 128* 124*  BUN 11 9  CREATININE 0.76 0.84  CALCIUM 8.4* 7.8*   PT/INR Recent Labs    05/05/18 1838  LABPROT 12.9  INR 0.98   CMP     Component Value Date/Time   NA 136 05/06/2018 0234   K 3.1 (L) 05/06/2018 0234   CL 98 05/06/2018 0234   CO2 27 05/06/2018 0234   GLUCOSE 124 (H) 05/06/2018 0234   BUN 9 05/06/2018 0234   CREATININE 0.84 05/06/2018 0234   CALCIUM 7.8 (L) 05/06/2018 0234   PROT 5.6 (L) 05/06/2018 0234   ALBUMIN 2.1 (L) 05/06/2018 0234   AST 31 05/06/2018 0234   ALT 94 (H) 05/06/2018 0234   ALKPHOS 122 05/06/2018 0234   BILITOT 1.0 05/06/2018 0234   GFRNONAA >60 05/06/2018 0234   GFRAA >60 05/06/2018 0234   Lipase  No results found for:  LIPASE     Studies/Results: No results found.  Anti-infectives: Anti-infectives (From admission, onward)   Start     Dose/Rate Route Frequency Ordered Stop   05/07/18 1600  vancomycin (VANCOCIN) IVPB 750 mg/150 ml premix     750 mg 150 mL/hr over 60 Minutes Intravenous Every 8 hours 05/07/18 0708     05/07/18 0715  vancomycin (VANCOCIN) 1,250 mg in sodium chloride 0.9 % 250 mL IVPB     1,250 mg 166.7 mL/hr over 90 Minutes Intravenous  Once 05/07/18 0701     05/06/18 1100  ceFEPIme (MAXIPIME) 2 g in sodium chloride 0.9 % 100 mL IVPB     2 g 200 mL/hr over 30 Minutes Intravenous Every 8 hours 05/06/18 1023     05/06/18 0600  vancomycin (VANCOCIN) IVPB 1000 mg/200 mL premix     1,000 mg 200 mL/hr over 60 Minutes Intravenous  Once 05/05/18 2213 05/06/18 0623   05/05/18 1900  vancomycin (VANCOCIN) 1,500 mg in sodium chloride 0.9 % 500 mL IVPB     1,500 mg 250 mL/hr over 120 Minutes Intravenous  Once 05/05/18 1831 05/05/18 2003   05/05/18 1900  meropenem (MERREM) 2 g in sodium chloride 0.9 % 100 mL IVPB  Status:  Discontinued     2 g 200 mL/hr over 30 Minutes Intravenous  Once 05/05/18 1843 05/06/18 1023       Assessment/Plan Polysubstance abuse Depression  Abscesses of left arm/shoulder/chest wall S/p I&D of large communicating abscess of left arm/shoulder/chest wall 12/12 Dr. Cliffton Asters - extending down to muscle but not involving the muscle - POD 2 - cx pending, GS w/ G+ cocci, GNR, G+ rods   ID - vancomycin 12/12>>12/13, merrem 12/12>> ; WBC 7.9 on 12/14  FEN - reg diet VTE - SCDs, lovenox Foley - none  Plan - Continue IV abx and follow culture. Continue daily dressing changes (loosely pack 3 incisions with saline dampened kerlex and apply dry ABD on top); penrose drains to remain in place.   LOS: 2 days    Hosie Spangle, [facility] Pager: [phone]

## 2018-05-07 NOTE — Progress Notes (Signed)
   Subjective: No acute events overnight.  She states that she feels well this morning.  Pain is well controlled.  Remains with good appetite and ambulating in the room.  Objective:  Vital signs in last 24 hours: Vitals:   05/06/18 1021 05/06/18 1540 05/06/18 2148 05/07/18 0437  BP: 113/63 109/64 117/71 113/70  Pulse: (!) 106 (!) 107 (!) 114 76  Resp: 18 18 18 19   Temp: 98.3 F (36.8 C) 98.5 F (36.9 C) 98.7 F (37.1 C) (!) 97.4 F (36.3 C)  TempSrc: Oral Oral Oral Oral  SpO2: 97% 95% 94% 96%  Weight:      Height:       General: Sitting up in bed, husband is at bedside MSK: Left shoulder dressing in place, foul odor and small amount of discharge seen through bandage.  Range of motion is roughly 45 degrees with abduction Cardiac: Tachycardic but regular rhythm Pulmonary: Clear to auscultation bilaterally Extremities: No lower extremity edema no calf tenderness  Micro: Wound culture: +GPC, +GPR, +GNR BCx: NG<24h  Abx:  Vanco (12/12 - present) Cefepime (12/13 - present)  Assessment/Plan:  Active Problems:   Abscess of arm   Abscess of chest wall  1.  Left shoulder abscess: Status post I&D on 12/11.  Penrose drains in place. Leukocytosis has resolved. She remains afebrile.  -Cultures pending -Surgery planning for dressing change this morning.  Foul odor noted on my exam today -Continue vancomycin and cefepime -Pain control with Percocet and oxycodone, currently using every 4 hours  Dispo: Anticipated discharge in approximately 1 day, pending culture results.  Will transition to oral antibiotics upon discharge.  Ali LoweVogel, Trent Theisen S, MD 05/07/2018, 10:55 AM Pager: [phone]

## 2018-05-08 DIAGNOSIS — R6510 Systemic inflammatory response syndrome (SIRS) of non-infectious origin without acute organ dysfunction: Secondary | ICD-10-CM

## 2018-05-08 LAB — AEROBIC/ANAEROBIC CULTURE W GRAM STAIN (SURGICAL/DEEP WOUND)

## 2018-05-08 LAB — FUNGUS STAIN

## 2018-05-08 LAB — FUNGAL STAIN REFLEX

## 2018-05-08 MED ORDER — TRAMADOL HCL 50 MG PO TABS
50.0000 mg | ORAL_TABLET | Freq: Once | ORAL | Status: AC
  Administered 2018-05-08: 50 mg via ORAL
  Filled 2018-05-08: qty 1

## 2018-05-08 MED ORDER — OXYCODONE HCL 5 MG PO TABS: 10.0000 mg | ORAL_TABLET | ORAL | Status: DC | PRN

## 2018-05-08 MED ORDER — OXYCODONE HCL 5 MG PO TABS
5.0000 mg | ORAL_TABLET | ORAL | Status: DC | PRN
  Administered 2018-05-08 – 2018-05-11 (×17): 10 mg via ORAL
  Filled 2018-05-08 (×17): qty 2

## 2018-05-08 MED ORDER — ACETAMINOPHEN 500 MG PO TABS
1000.0000 mg | ORAL_TABLET | Freq: Four times a day (QID) | ORAL | Status: DC
  Administered 2018-05-08 – 2018-05-11 (×13): 1000 mg via ORAL
  Filled 2018-05-08 (×13): qty 2

## 2018-05-08 MED ORDER — IBUPROFEN 600 MG PO TABS
600.0000 mg | ORAL_TABLET | Freq: Four times a day (QID) | ORAL | Status: DC | PRN
  Administered 2018-05-08 – 2018-05-09 (×3): 600 mg via ORAL
  Filled 2018-05-08 (×3): qty 1

## 2018-05-08 MED ORDER — AMOXICILLIN-POT CLAVULANATE 875-125 MG PO TABS
1.0000 | ORAL_TABLET | Freq: Two times a day (BID) | ORAL | Status: DC
  Administered 2018-05-08 – 2018-05-11 (×6): 1 via ORAL
  Filled 2018-05-08 (×6): qty 1

## 2018-05-08 NOTE — Plan of Care (Signed)
  Problem: Skin Integrity: Goal: Demonstration of wound healing without infection will improve Outcome: Progressing   Problem: Education: Goal: Knowledge of General Education information will improve Description Including pain rating scale, medication(s)/side effects and non-pharmacologic comfort measures Outcome: Progressing   Problem: Health Behavior/Discharge Planning: Goal: Ability to manage health-related needs will improve Outcome: Progressing   Problem: Clinical Measurements: Goal: Ability to maintain clinical measurements within normal limits will improve Outcome: Progressing   Problem: Activity: Goal: Risk for activity intolerance will decrease Outcome: Progressing   Problem: Nutrition: Goal: Adequate nutrition will be maintained Outcome: Progressing   Problem: Elimination: Goal: Will not experience complications related to bowel motility Outcome: Progressing Goal: Will not experience complications related to urinary retention Outcome: Progressing   Problem: Pain Managment: Goal: General experience of comfort will improve Outcome: Progressing

## 2018-05-08 NOTE — Progress Notes (Signed)
[facility] Progress Note  3 Days Post-Op  Subjective: CC:  Taking PO meds for pain. Tolerating PO. Asked about wound care at home, patient expresses some concern that her husband will not be able to pack the wounds at home.   Objective: Vital signs in last 24 hours: Temp:  [98.2 F (36.8 C)-100.3 F (37.9 C)] 98.4 F (36.9 C) (12/15 0543) Pulse Rate:  [84-100] 89 (12/15 0543) Resp:  [16] 16 (12/15 0543) BP: (110-125)/(64-83) 114/74 (12/15 0543) SpO2:  [94 %-97 %] 94 % (12/15 0543) Last BM Date: 05/07/18  Intake/Output from previous day: 12/14 0701 - 12/15 0700 In: 2963.2 [P.O.:960; I.V.:1403.2; IV Piggyback:600] Out: 2 [Urine:2] Intake/Output this shift: No intake/output data recorded.  PE: Gen:  Alert, NAD, cooperative Abd: Soft, non-tender, non-distended Left shoulder chest wall: unfortunately I forgot to take a photo but I performed pts dressing change at bedside today;   - roughly 3 cm wound left upper anterior arm without undermining or significant drainage; some induration left medial arm towards axilla.  - penrose left shoulder stable, packed with trimmed 4x4, minimal surrounding erythema  - infraclavicular penrose draining appropriately, not packed, minimal surrounding erythema  - penrose closest to upper outer quadrant of breast draining a significant amount of purulence, wound around inferior loop of penrose has 2-3 cm undermining medially and inferiorly, about 1 cm erythema surrounds this wound.   Skin: warm and dry Psych: A&Ox3   Lab Results:  Recent Labs    05/06/18 0234 05/07/18 0432  WBC 12.7* 7.9  HGB 9.8* 9.0*  HCT 30.2* 29.1*  PLT 204 230   BMET Recent Labs    05/05/18 1744 05/06/18 0234  NA 134* 136  K 3.0* 3.1*  CL 96* 98  CO2 25 27  GLUCOSE 128* 124*  BUN 11 9  CREATININE 0.76 0.84  CALCIUM 8.4* 7.8*   PT/INR Recent Labs    05/05/18 1838  LABPROT 12.9  INR 0.98   CMP     Component Value Date/Time   NA 136  05/06/2018 0234   K 3.1 (L) 05/06/2018 0234   CL 98 05/06/2018 0234   CO2 27 05/06/2018 0234   GLUCOSE 124 (H) 05/06/2018 0234   BUN 9 05/06/2018 0234   CREATININE 0.84 05/06/2018 0234   CALCIUM 7.8 (L) 05/06/2018 0234   PROT 5.6 (L) 05/06/2018 0234   ALBUMIN 2.1 (L) 05/06/2018 0234   AST 31 05/06/2018 0234   ALT 94 (H) 05/06/2018 0234   ALKPHOS 122 05/06/2018 0234   BILITOT 1.0 05/06/2018 0234   GFRNONAA >60 05/06/2018 0234   GFRAA >60 05/06/2018 0234   Lipase  No results found for: LIPASE     Studies/Results: No results found.  Anti-infectives: Anti-infectives (From admission, onward)   Start     Dose/Rate Route Frequency Ordered Stop   05/07/18 1600  vancomycin (VANCOCIN) IVPB 750 mg/150 ml premix     750 mg 150 mL/hr over 60 Minutes Intravenous Every 8 hours 05/07/18 0708     05/07/18 0715  vancomycin (VANCOCIN) 1,250 mg in sodium chloride 0.9 % 250 mL IVPB     1,250 mg 166.7 mL/hr over 90 Minutes Intravenous  Once 05/07/18 0701 05/07/18 0921   05/06/18 1100  ceFEPIme (MAXIPIME) 2 g in sodium chloride 0.9 % 100 mL IVPB     2 g 200 mL/hr over 30 Minutes Intravenous Every 8 hours 05/06/18 1023     05/06/18 0600  vancomycin (VANCOCIN) IVPB 1000 mg/200 mL premix  1,000 mg 200 mL/hr over 60 Minutes Intravenous  Once 05/05/18 2213 05/06/18 0623   05/05/18 1900  vancomycin (VANCOCIN) 1,500 mg in sodium chloride 0.9 % 500 mL IVPB     1,500 mg 250 mL/hr over 120 Minutes Intravenous  Once 05/05/18 1831 05/05/18 2003   05/05/18 1900  meropenem (MERREM) 2 g in sodium chloride 0.9 % 100 mL IVPB  Status:  Discontinued     2 g 200 mL/hr over 30 Minutes Intravenous  Once 05/05/18 1843 05/06/18 1023     Assessment/Plan Polysubstance abuse Depression  Abscesses of left arm/shoulder/chest wall S/p I&Dof large communicating abscess of left arm/shoulder/chest wall 12/12 Dr. Cliffton AstersWhite - extending down to muscle but not involving the muscle - POD 3 - Cx growing abundant strep  group C, follow sesitivities   ID -vancomycin 12/12>>12/13, merrem 12/12>> ; WBC 7.9 on 12/14  FEN -reg diet VTE -SCDs, lovenox Foley -none  Plan- Continue IV abx and await sensititvities. Continue daily dressing changes (loosely pack 3 incisions with saline dampened kerlex and apply dry ABD on top); penrose drains to remain in place. Patient may shower after packing has been removed. Shower with wounds open, dry shoulder throroughly, then re-dress wound. Patient and husband are supposed to discuss who is able to help with wound care at home today.    LOS: 3 days    Hosie SpangleElizabeth Ebelin Dillehay, [facility] Pager: [phone]

## 2018-05-08 NOTE — Progress Notes (Signed)
   Subjective:   [name] was seen resting comfortably in her bed. She states that her pain is being adequately controlled.   Objective:  Vital signs in last 24 hours: Vitals:   05/07/18 0437 05/07/18 1503 05/07/18 2228 05/08/18 0543  BP: 113/70 125/83 110/64 114/74  Pulse: 76 84 100 89  Resp: 19 16 16 16   Temp: (!) 97.4 F (36.3 C) 98.2 F (36.8 C) 100.3 F (37.9 C) 98.4 F (36.9 C)  TempSrc: Oral Oral Oral Oral  SpO2: 96% 97% 94% 94%  Weight:      Height:       Physical Exam  Constitutional: She appears well-developed and well-nourished. No distress.  HENT:  Head: Normocephalic and atraumatic.  Eyes: Conjunctivae are normal.  Cardiovascular: Normal rate, regular rhythm and normal heart sounds.  Respiratory: Effort normal and breath sounds normal. No respiratory distress. She has no wheezes.  GI: Soft. Bowel sounds are normal. She exhibits no distension. There is no abdominal tenderness.  Musculoskeletal:        General: No edema.  Neurological: She is alert.  Skin: She is not diaphoretic.  Left shoulder and anterior left chest wrapped in gauze  Psychiatric: She has a normal mood and affect. Her behavior is normal. Judgment and thought content normal.   Assessment/Plan:  Left arm/shoulder/chest wall abscess Patient had I/D done on 05/05/18. Cultures growing strep group c and mixed anaerobic flora. Called micro lab and they mentioned that susceptibilities cannot be done. Suggest transitioning to po Augmentin and discharge with home health services.   -Requested nursing staff to educate husband on packing  -Spoke with surgery who recommended the patient stay overnight on oral abx and have the husband change dressing morning 12/16 under supervision prior to discharging home.  -Started augmentin, patient stated that she had an allergic reaction to pcn as a child that caused hives and not anaphylaxis. -Augmentin 875-125mg  bid -Oxycodone 5-10mg  q4hrs prn  H/O opiate and  meth use Last use 05/05/09 -Monitor for withdrawal symptoms   Dispo: Anticipated discharge 12/14.  Lorenso Courierhundi, Karolina Zamor, MD 05/08/2018, 11:10 AM Pager: [phone]

## 2018-05-09 DIAGNOSIS — B954 Other streptococcus as the cause of diseases classified elsewhere: Secondary | ICD-10-CM

## 2018-05-09 LAB — CBC
HCT: 32.2 % — ABNORMAL LOW (ref 36.0–46.0)
Hemoglobin: 10.1 g/dL — ABNORMAL LOW (ref 12.0–15.0)
MCH: 29.6 pg (ref 26.0–34.0)
MCHC: 31.4 g/dL (ref 30.0–36.0)
MCV: 94.4 fL (ref 80.0–100.0)
Platelets: 325 10*3/uL (ref 150–400)
RBC: 3.41 MIL/uL — AB (ref 3.87–5.11)
RDW: 13.6 % (ref 11.5–15.5)
WBC: 7.4 10*3/uL (ref 4.0–10.5)
nRBC: 0 % (ref 0.0–0.2)

## 2018-05-09 MED ORDER — KETOROLAC TROMETHAMINE 15 MG/ML IJ SOLN: 15.0000 mg | Freq: Two times a day (BID) | INTRAMUSCULAR | Status: DC | PRN

## 2018-05-09 MED ORDER — KETOROLAC TROMETHAMINE 30 MG/ML IJ SOLN
30.0000 mg | Freq: Two times a day (BID) | INTRAMUSCULAR | Status: DC
  Administered 2018-05-09 – 2018-05-10 (×3): 30 mg via INTRAVENOUS
  Filled 2018-05-09 (×3): qty 1

## 2018-05-09 NOTE — Progress Notes (Signed)
4 Days Post-Op    CC:  Subjective: OK till I take down the dressing and she has a very hard time tolerating the dressing change.  Dressing are purulent and will apparently only changed once yesterday.   Objective: Vital signs in last 24 hours: Temp:  [97.6 F (36.4 C)-98.4 F (36.9 C)] 97.6 F (36.4 C) (12/16 0512) Pulse Rate:  [65-80] 80 (12/16 0512) Resp:  [14-16] 16 (12/16 0512) BP: (109-113)/(63-69) 109/63 (12/16 0512) SpO2:  [97 %-99 %] 97 % (12/16 0512) Last BM Date: 05/07/18 444 p.o. 900 IV Voided x5 No BM recorded Afebrile vital signs are stable No labs today last CBC 7.9 on 05/07/2018   Intake/Output from previous day: 12/15 0701 - 12/16 0700 In: 1338.7 [P.O.:444; I.V.:894.7] Out: -  Intake/Output this shift: Total I/O In: 330 [P.O.:330] Out: -   General appearance: alert, cooperative, flushed and pretty intolerant of any attempts to work with shoulder.  Skin: Skin color, texture, turgor normal. No rashes or lesions or picture below.  Dressing were soaked and purulent.  left shoulder open site tracks up to the site above it.      Lab Results:  Recent Labs    05/07/18 0432  WBC 7.9  HGB 9.0*  HCT 29.1*  PLT 230    BMET No results for input(s): NA, K, CL, CO2, GLUCOSE, BUN, CREATININE, CALCIUM in the last 72 hours. PT/INR No results for input(s): LABPROT, INR in the last 72 hours.  Recent Labs  Lab 05/06/18 0234  AST 31  ALT 94*  ALKPHOS 122  BILITOT 1.0  PROT 5.6*  ALBUMIN 2.1*     Lipase  No results found for: LIPASE   Medications: . acetaminophen  1,000 mg Oral Q6H  . amoxicillin-clavulanate  1 tablet Oral Q12H  . citalopram  40 mg Oral Daily  . enoxaparin (LOVENOX) injection  40 mg Subcutaneous Q24H  . nicotine  21 mg Transdermal Daily  . traZODone  50-100 mg Oral QHS   . lactated ringers 100 mL/hr at 05/09/18 0834   Anti-infectives (From admission, onward)   Start     Dose/Rate Route Frequency Ordered Stop   05/08/18 2200   amoxicillin-clavulanate (AUGMENTIN) 875-125 MG per tablet 1 tablet     1 tablet Oral Every 12 hours 05/08/18 1120     05/07/18 1600  vancomycin (VANCOCIN) IVPB 750 mg/150 ml premix  Status:  Discontinued     750 mg 150 mL/hr over 60 Minutes Intravenous Every 8 hours 05/07/18 0708 05/08/18 1128   05/07/18 0715  vancomycin (VANCOCIN) 1,250 mg in sodium chloride 0.9 % 250 mL IVPB     1,250 mg 166.7 mL/hr over 90 Minutes Intravenous  Once 05/07/18 0701 05/07/18 0921   05/06/18 1100  ceFEPIme (MAXIPIME) 2 g in sodium chloride 0.9 % 100 mL IVPB  Status:  Discontinued     2 g 200 mL/hr over 30 Minutes Intravenous Every 8 hours 05/06/18 1023 05/08/18 1120   05/06/18 0600  vancomycin (VANCOCIN) IVPB 1000 mg/200 mL premix     1,000 mg 200 mL/hr over 60 Minutes Intravenous  Once 05/05/18 2213 05/06/18 0623   05/05/18 1900  vancomycin (VANCOCIN) 1,500 mg in sodium chloride 0.9 % 500 mL IVPB     1,500 mg 250 mL/hr over 120 Minutes Intravenous  Once 05/05/18 1831 05/05/18 2003   05/05/18 1900  meropenem (MERREM) 2 g in sodium chloride 0.9 % 100 mL IVPB  Status:  Discontinued     2 g 200 mL/hr  over 30 Minutes Intravenous  Once 05/05/18 1843 05/06/18 1023      Assessment/Plan  Polysubstance abuse - opiates/methamphetamine/heroin -denies recent injections Depression  Abscesses of left arm/shoulder/chest wall S/p I&Dof large communicating abscess of left arm/shoulder/chest wall 12/12 Dr. Cliffton AstersWhite POD #4 - extending down to muscle but not involving the muscle - Cx growing abundant strep group C, follow sesitivities   ID -vancomycin 12/12>>12/15, merrem 12/12-12/13; Augmentin 12/15>> day 2 FEN -reg diet VTE -SCDs, lovenox Foley -none Follow-up: Dr. Cliffton AstersWhite  Plan:  She needs bid dressing changes, I told them to put her in the shower, and then let them redress the site.  Her husband is in the room with her.  I'm not sure she is going to get this done at home.  Case manager seeing her and trying  to get her some help.           LOS: 4 days    Kandra Graven 05/09/2018 [phone]

## 2018-05-09 NOTE — Anesthesia Postprocedure Evaluation (Signed)
Anesthesia Post Note  Patient: Lori GlassingWendy G Baker  Procedure(s) Performed: INCISION AND DRAINAGE LEFT SHOULDER ABSCESS AND DRAIN PLACEMENT (Left Shoulder)     Patient location during evaluation: PACU Anesthesia Type: General Level of consciousness: awake and alert Pain management: pain level controlled Vital Signs Assessment: post-procedure vital signs reviewed and stable Respiratory status: spontaneous breathing, nonlabored ventilation, respiratory function stable and patient connected to nasal cannula oxygen Cardiovascular status: blood pressure returned to baseline and stable Postop Assessment: no apparent nausea or vomiting Anesthetic complications: no    Last Vitals:  Vitals:   05/08/18 2103 05/09/18 0512  BP: 112/69 109/63  Pulse: 80 80  Resp: 16 16  Temp: 36.9 C 36.4 C  SpO2: 98% 97%    Last Pain:  Vitals:   05/09/18 0745  TempSrc:   PainSc: Asleep                 [name] S

## 2018-05-09 NOTE — Care Management Note (Addendum)
Case Management Note  Patient Details  Name: Lori Baker MRN: [id number] Date of Birth: 04/01/1986  Subjective/Objective:                    Action/Plan:  Discussed discharge planning with patient at bedside.   Home address is: [address], [place], [place] [address]   Phone [phone]    Discussed MATCH  Program with patient, patient does not have $3 co pay will over ride $3 co pay, asked Dr Petra KubaVogel to send prescriptions to [facility].    Spoke with Marlyne BeardsJennings PA for [facility], DR Magnus IvanBlackman will follow patient for wound care.   Explained to patient [facility] will come talk to her to see if she qualifies for charity home health. Husband will have to learn wound care , because if patient does qualify for home health [facility]. Patient voices understanding.   [facility], awaiting [facility].  [facility] unable to accept referral due to home environment safety issues for home health RN. Patient, husband aware, encouraged them to work with bedside nurse with wound care as much as possible. Bedside nurse, DR Hanley HaysVogel and Jennings PA aware. Expected Discharge Date:  05/03/18               Expected Discharge Plan:  Home w Home Health Services  In-House Referral:  Financial Counselor  Discharge planning Services  CM Consult, Medication Assistance, [facility]  Post Acute Care Choice:  Home Health Choice offered to:  Patient  DME Arranged:  N/A DME Agency:  NA  HH Arranged:  RN HH Agency:  [facility]  Status of Service:  Completed, signed off  If discussed at Long Length of Stay Meetings, dates discussed:    Additional Comments:  [name], Lori Desouza Marie, RN 05/09/2018, 10:16 AM

## 2018-05-09 NOTE — Progress Notes (Signed)
   Subjective: In tears today when thinking about how painful the dressing changes are. Expressing concerns over how to change dressings at home given her pain level. Also they don't have transportation home as their car got towed.   Objective:  Vital signs in last 24 hours: Vitals:   05/08/18 1316 05/08/18 2103 05/09/18 0512 05/09/18 1446  BP: 113/67 112/69 109/63 109/61  Pulse: 65 80 80 71  Resp: 14 16 16 19   Temp: 98 F (36.7 C) 98.4 F (36.9 C) 97.6 F (36.4 C) 97.8 F (36.6 C)  TempSrc: Oral Oral Oral Oral  SpO2: 99% 98% 97% 98%  Weight:      Height:       General: Sitting up in bed, husband is at bedside, tearful  MSK: Left shoulder dressing in place, clean and dry. No foul odor  Cardiac: RRR Extremities: No lower extremity edema   Assessment/Plan:  Active Problems:   Abscess of arm   Abscess of chest wall  1.  Left shoulder abscess: Status post I&D on 12/11.  Penrose drains in place. Afebrile. Endorsing significant pain this morning and fear of dressing changes because of the pain. Also worried that her husband won't be able to perform the dressing changes.  -Cultures growing group C strep, sensitivities will not be done as resistant is low - continue po augmentin to complete a 14 day total course of antibiotics   Vancomycin, cefepime (12/13 - 12/15)  augmentin (12/15 - 12/27) -Pain control with schedule tylenol, prn ibuprofen and oxycodone (5-10mg  q4h) - purulent drainage with dressing change today. She will need BID dressing changes at home.  - She cannot get home health as it is deemed an unsafe environment given her history of IV drug use.  - encourage and educate husband to help with dressing changes   Dispo: Anticipated discharge in approximately 1 day pending pain management  Ali LoweVogel, Marie S, MD 05/09/2018, 2:55 PM Pager: [phone]

## 2018-05-10 LAB — CBC
HCT: 30 % — ABNORMAL LOW (ref 36.0–46.0)
Hemoglobin: 9.4 g/dL — ABNORMAL LOW (ref 12.0–15.0)
MCH: 29.7 pg (ref 26.0–34.0)
MCHC: 31.3 g/dL (ref 30.0–36.0)
MCV: 94.6 fL (ref 80.0–100.0)
PLATELETS: 351 10*3/uL (ref 150–400)
RBC: 3.17 MIL/uL — ABNORMAL LOW (ref 3.87–5.11)
RDW: 13.4 % (ref 11.5–15.5)
WBC: 8.5 10*3/uL (ref 4.0–10.5)
nRBC: 0 % (ref 0.0–0.2)

## 2018-05-10 LAB — BASIC METABOLIC PANEL
Anion gap: 8 (ref 5–15)
BUN: 12 mg/dL (ref 6–20)
CHLORIDE: 103 mmol/L (ref 98–111)
CO2: 27 mmol/L (ref 22–32)
Calcium: 8.1 mg/dL — ABNORMAL LOW (ref 8.9–10.3)
Creatinine, Ser: 0.76 mg/dL (ref 0.44–1.00)
GFR calc Af Amer: >60 mL/min (ref >60)
GFR calc non Af Amer: >60 mL/min (ref >60)
Glucose, Bld: 100 mg/dL — ABNORMAL HIGH (ref 70–99)
POTASSIUM: 4.2 mmol/L (ref 3.5–5.1)
Sodium: 138 mmol/L (ref 135–145)

## 2018-05-10 LAB — CULTURE, BLOOD (ROUTINE X 2)
CULTURE: NO GROWTH
CULTURE: NO GROWTH

## 2018-05-10 MED ORDER — IBUPROFEN 600 MG PO TABS: 600.0000 mg | ORAL_TABLET | Freq: Four times a day (QID) | ORAL | Status: DC | PRN

## 2018-05-10 MED ORDER — ACETAMINOPHEN 500 MG PO TABS: 1000.0000 mg | ORAL_TABLET | Freq: Four times a day (QID) | ORAL | 0 refills | Status: AC

## 2018-05-10 MED ORDER — KETOROLAC TROMETHAMINE 10 MG PO TABS
10.0000 mg | ORAL_TABLET | Freq: Four times a day (QID) | ORAL | Status: DC
  Filled 2018-05-10 (×2): qty 1

## 2018-05-10 MED ORDER — OXYCODONE HCL 5 MG PO TABS: 10.0000 mg | ORAL_TABLET | Freq: Two times a day (BID) | ORAL | 0 refills | Status: AC

## 2018-05-10 MED ORDER — AMOXICILLIN-POT CLAVULANATE 875-125 MG PO TABS: 1.0000 | ORAL_TABLET | Freq: Two times a day (BID) | ORAL | 0 refills | Status: AC

## 2018-05-10 MED ORDER — KETOROLAC TROMETHAMINE 10 MG PO TABS
10.0000 mg | ORAL_TABLET | Freq: Four times a day (QID) | ORAL | Status: DC
  Administered 2018-05-10 – 2018-05-11 (×3): 10 mg via ORAL
  Filled 2018-05-10 (×4): qty 1

## 2018-05-10 MED FILL — oxyCODONE HCL 5 MG TABS: 5 | 12 days supply | Qty: 24 | Fill #0

## 2018-05-10 MED FILL — AMOX-CLAV 875-125 MG TABLET: 875-125 | 13 days supply | Qty: 26 | Fill #0

## 2018-05-10 NOTE — Discharge Instructions (Signed)
Mechanical Wound Debridement Shower, with packing out 1-2 times per day. Clean the sites with soap and water, clean the drains with soap and water also.  Repack the open sites with sterile 4 x 4, opened and wet with sterile saline.  These sites should get smaller over the next 1-2 weeks, and take less packing.  The drains will stay in and Dr. Cliffton AstersWhite will take them out in the office.    Mechanical wound debridement is a treatment to remove dead tissue from a wound. This helps the wound heal. The treatment involves cleaning the wound (irrigation) and using a pad or gauze (dressing) to remove dead tissue and debris from the wound. There are different types of mechanical wound debridement. Depending on the wound, you may need to repeat this procedure or change to another form of debridement as your wound starts to heal. Tell a health care provider about:  Any allergies you have.  All medicines you are taking, including vitamins, herbs, eye drops, creams, and over-the-counter medicines.  Any blood disorders you have.  Any medical conditions you have, including any conditions that: ? Cause a significant decrease in blood circulation to the part of the body where the wound is, such as peripheral vascular disease. ? Compromise your defense (immune) system or white blood count.  Any surgeries you have had.  Whether you are pregnant or may be pregnant. What are the risks? Generally, this is a safe procedure. However, problems may occur, including:  Infection.  Bleeding.  Damage to healthy tissue in and around your wound.  Soreness or pain.  Failure of the wound to heal.  Scarring.  What happens before the procedure? You may be given antibiotic medicine to help prevent infection. What happens during the procedure?  Your health care provider may apply a numbing medicine (topical anesthetic) to the wound.  Your health care provider will irrigate your wound with a germ-free (sterile),  salt-water (saline) solution. This removes debris, bacteria, and dead tissue.  Depending on what type of mechanical wound debridement you are having, your health care provider may do one of the following: ? Put a dressing on your wound. You may have dry gauze pad placed into the wound. Your health care provider will remove the gauze after the wound is dry. Any dead tissue and debris that has dried into the gauze will be lifted out of the wound (wet-to-dry debridement). ? Use a type of pad (monofilament fiber debridement pad). This pad has a fluffy surface on one side that picks up dead tissue and debris from your wound. Your health care provider wets the pad and wipes it over your wound for several minutes. ? Irrigate your wound with a pressurized stream of solution such as saline or water.  Once your health care provider is finished, he or she may apply a light dressing to your wound. The procedure may vary among health care providers and hospitals. What happens after the procedure?  You may receive medicine for pain.  You will continue to receive antibiotic medicine if it was started before your procedure. This information is not intended to replace advice given to you by your health care provider. Make sure you discuss any questions you have with your health care provider. Document Released: 01/30/2015 Document Revised: 10/17/2015 Document Reviewed: 09/19/2014 Elsevier Interactive Patient Education  [employer]2018 [employer]

## 2018-05-10 NOTE — Progress Notes (Signed)
5 Days Post-Op    CC:  Shoulder abscess  Subjective: Dressing and sites look much better.  She is doing better with the Toradol.    Objective: Vital signs in last 24 hours: Temp:  [97.8 F (36.6 C)-98.2 F (36.8 C)] 98.2 F (36.8 C) (12/17 0534) Pulse Rate:  [61-97] 61 (12/17 0534) Resp:  [18-19] 18 (12/17 0534) BP: (103-111)/(61-72) 103/72 (12/17 0534) SpO2:  [96 %-98 %] 97 % (12/17 0534) Last BM Date: 05/07/18 570 PO Nothing else recorded for intake Voided x 5 No BM  Afebrile, VSS Labs are normal    Intake/Output from previous day: 12/16 0701 - 12/17 0700 In: 570 [P.O.:570] Out: -  Intake/Output this shift: No intake/output data recorded.  General appearance: alert, cooperative and no distress Skin: sites all look better, much less drainage.    Lab Results:  Recent Labs    05/09/18 1307 05/10/18 0130  WBC 7.4 8.5  HGB 10.1* 9.4*  HCT 32.2* 30.0*  PLT 325 351    BMET Recent Labs    05/10/18 0130  NA 138  K 4.2  CL 103  CO2 27  GLUCOSE 100*  BUN 12  CREATININE 0.76  CALCIUM 8.1*   PT/INR No results for input(s): LABPROT, INR in the last 72 hours.  Recent Labs  Lab 05/06/18 0234  AST 31  ALT 94*  ALKPHOS 122  BILITOT 1.0  PROT 5.6*  ALBUMIN 2.1*     Lipase  No results found for: LIPASE   Medications: . acetaminophen  1,000 mg Oral Q6H  . amoxicillin-clavulanate  1 tablet Oral Q12H  . citalopram  40 mg Oral Daily  . enoxaparin (LOVENOX) injection  40 mg Subcutaneous Q24H  . ketorolac  30 mg Intravenous BID  . nicotine  21 mg Transdermal Daily  . traZODone  50-100 mg Oral QHS    Assessment/Plan Polysubstance abuse - opiates/methamphetamine/heroin -denies recent injections Depression  Abscesses of left arm/shoulder/chest wall S/p I&Dof large communicating abscess of left arm/shoulder/chest wall 12/12 Dr. Cliffton AstersWhite POD #4 - extending down to muscle but not involving the muscle - Cx growing abundant strep group C, follow  sesitivities  ID -vancomycin 12/12>>12/15, merrem 12/12-12/13; Augmentin 12/15>> day 2 FEN -reg diet VTE -SCDs, lovenox Foley -none Follow-up: Dr. Cliffton AstersWhite    Plan:  I would keep her today and give her one more day of in hospital care.  Pain and dressing changes are the biggest issue, combined with social issues.   I will start her on Ibuprofen tonight and she should be able to go tomorrow.  I have a follow up appointment with Dr. Cliffton AstersWhite in the [facility].  He will take out the penrose drains in the office.  Because of the drug use HH will not go to her home to help, so we need to be sure her and her husband are handling dressing changes.       LOS: 5 days    Lori Baker 05/10/2018 [phone]

## 2018-05-10 NOTE — Progress Notes (Addendum)
   Subjective: Continues endorsing significant pain. Dressing was removed and patient tolerated it better than expected. We informed them of the plan to go home tomorrow and encouraged them that they are ready to discharge. They have a ride available at 10:45am tomorrow   Objective:  Vital signs in last 24 hours: Vitals:   05/09/18 0512 05/09/18 1446 05/09/18 2001 05/10/18 0534  BP: 109/63 109/61 111/66 103/72  Pulse: 80 71 97 61  Resp: 16 19  18   Temp: 97.6 F (36.4 C) 97.8 F (36.6 C) 98 F (36.7 C) 98.2 F (36.8 C)  TempSrc: Oral Oral Oral Oral  SpO2: 97% 98% 96% 97%  Weight:      Height:       Gen: up walking to bathroom MSK: left shoulder bandage removed, no foul odor, minimal drainage from one incision. The majority of the dressing is clean and dry. Limited ROM due to pain Pulm: CTAB Card: RRR Ext: warm, no LEE  Assessment/Plan:  Active Problems:   Abscess of arm   Abscess of chest wall  1.  Left shoulder abscess: Status post I&D on 12/11.  Penrose drains in place. Afebrile. Continues endorsing pain. Surgery feels she can go home tomorrow. F/u is scheduled for 12/30.   - continue po augmentin to complete a 14 day total course of antibiotics   Vancomycin, cefepime (12/13 - 12/15)  augmentin (12/15 - 12/27) -Pain control with schedule tylenol and po toradol, prn ibuprofen and oxycodone (5-10mg  q4h) - encourage patient and significant other that they can go home tomorrow. We will give them supplies for the dressing changes and po oxycodone to take BID before dressing changes  Dispo: Anticipated discharge tomorrow before 10:45am. Their mom is picking them up and they cannot make her late to work. [facility] is aware of the discharge time   Lori Baker, Marie S, MD 05/10/2018, 11:14 AM Pager: [phone]

## 2018-05-10 NOTE — Plan of Care (Signed)
  Problem: Clinical Measurements: Goal: Postoperative complications will be avoided or minimized Outcome: Progressing   Problem: Skin Integrity: Goal: Demonstration of wound healing without infection will improve Outcome: Progressing   Problem: Skin Integrity: Goal: Demonstration of wound healing without infection will improve Outcome: Progressing   Problem: Education: Goal: Knowledge of General Education information will improve Description Including pain rating scale, medication(s)/side effects and non-pharmacologic comfort measures Outcome: Progressing   Problem: Health Behavior/Discharge Planning: Goal: Ability to manage health-related needs will improve Outcome: Progressing

## 2018-05-10 NOTE — Progress Notes (Signed)
  Date: 05/10/2018  Patient name: Lori ChenWendy G Schwanz  Medical record number: [id number]  Date of birth: 11/03/1985   I have seen and evaluated this patient and I have discussed the plan of care with the house staff. Please see Dr. Consuello BossierVogel's note for complete details. I concur with her findings.    Inez CatalinaMullen, Dula Havlik B, MD 05/10/2018, 3:52 PM

## 2018-05-11 NOTE — Plan of Care (Signed)
  Problem: Health Behavior/Discharge Planning: Goal: Ability to manage health-related needs will improve Outcome: Adequate for Discharge   Problem: Clinical Measurements: Goal: Ability to maintain clinical measurements within normal limits will improve Outcome: Adequate for Discharge   Problem: Activity: Goal: Risk for activity intolerance will decrease Outcome: Completed/Met   Problem: Nutrition: Goal: Adequate nutrition will be maintained Outcome: Adequate for Discharge   Problem: Elimination: Goal: Will not experience complications related to bowel motility Outcome: Adequate for Discharge

## 2018-05-11 NOTE — Progress Notes (Signed)
   Subjective: Did well overnight. Plan to discharge today. TOC is providing augmentin free of charge but unsure if the $9 copay for oxycodone can be waived. They only have $1.70 to their names. Discussed high dose ibuprofen and tylenol as an alternative.   Objective:  Vital signs in last 24 hours: Vitals:   05/10/18 0534 05/10/18 1337 05/10/18 2151 05/11/18 0523  BP: 103/72 113/71 122/74 113/81  Pulse: 61 77 82 80  Resp: 18 16 18 16   Temp: 98.2 F (36.8 C) 98.4 F (36.9 C) 98.9 F (37.2 C) 98.5 F (36.9 C)  TempSrc: Oral Oral Oral Oral  SpO2: 97% 97% 98% 97%  Weight:      Height:       Gen: laying in bed, resting comfortably Cardiac: RRR MSK: left shoulder dressing is clean and dry  Ext: no LEE or pain  Assessment/Plan:  Active Problems:   Abscess of arm   Abscess of chest wall  1.  Left shoulder abscess: Status post I&D on 12/11.   - husband did very well with dressing changes yesterday - has f/u with surgery 12/30 for penrose drain removal  - continue augmentin to complete a 14 day course, [facility]  - oxycodone 10mg  bid for dressing changes, [facility]    Dispo: Today. Appreciate case managements help with medication assistance. Hopefully, the copay for oxycodone can be waived.   Ali LoweVogel, Arlin Savona S, MD 05/11/2018, 8:26 AM Pager: [phone]

## 2018-05-11 NOTE — Progress Notes (Signed)
Pt has received all meds for home and has all supplies for dressing changes.  Reviewed use of all meds and alternating advil/tylenol for pain.  Pt and caregiver understand how to change the dressing (demonstrated) and all follow up appointments.

## 2018-05-11 NOTE — Discharge Summary (Signed)
Name: Lori Baker MRN: [id number] DOB: 03/24/1986 32 y.o. PCP: Patient, No Pcp Per  Date of Admission: 05/05/2018  5:10 PM Date of Discharge: 05/11/2018 Attending Physician: No att. providers found  Discharge Diagnosis: 1. Abscess of left arm/shoulder/chest wall   Discharge Medications: Allergies as of 05/11/2018      Reactions   Penicillins Hives   Has patient had a PCN reaction causing immediate rash, facial/tongue/throat swelling, SOB or lightheadedness with hypotension: Yes Has patient had a PCN reaction causing severe rash involving mucus membranes or skin necrosis: No Has patient had a PCN reaction that required hospitalization: No Has patient had a PCN reaction occurring within the last 10 years: No If all of the above answers are "NO", then may proceed with Cephalosporin use.      Medication List    TAKE these medications   acetaminophen 500 MG tablet Commonly known as:  TYLENOL Take 2 tablets (1,000 mg total) by mouth every 6 (six) hours.   amoxicillin-clavulanate 875-125 MG tablet Commonly known as:  AUGMENTIN Take 1 tablet by mouth every 12 (twelve) hours.   citalopram 40 MG tablet Commonly known as:  CELEXA Take 40 mg by mouth daily.   ibuprofen 400 MG tablet Commonly known as:  ADVIL,MOTRIN Take 400 mg by mouth every 8 (eight) hours as needed (for pain).   nicotine 21 mg/24hr patch Commonly known as:  NICODERM CQ - dosed in mg/24 hours Place 21 mg onto the skin daily.   oxyCODONE 5 MG immediate release tablet Commonly known as:  Oxy IR/ROXICODONE Take 2 tablets (10 mg total) by mouth 2 (two) times daily. Before dressing changes   traZODone 50 MG tablet Commonly known as:  DESYREL Take 50-100 mg by mouth at bedtime.            Discharge Care Instructions  (From admission, onward)         Start     Ordered   05/09/18 0000  Change dressing     05/09/18 1141          Disposition and follow-up:   LoriDeniqua G Baker was discharged  from [facility] in Stable condition.  At the hospital follow up visit please address:  1.  Please assess left shoulder wound, discharged with augmentin, oxycodone and supplies for BID dressing changes   2.  Labs / imaging needed at time of follow-up: none   3.  Pending labs/ test needing follow-up: none  Follow-up Appointments: Follow-up Information    [facility] Schedule an appointment as soon as possible for a visit.   Contact information: [address] [place] [address] [phone]       Andria Meuse, MD Follow up on 05/23/2018.   Specialty:  General Surgery Why:  Your appointment is at  3:45 PM. Bring photo ID and insurance information with you.       Benay Pillow information: [address] [place] [address] [phone]           Hospital Course by problem list: Lori Baker is a 32 y/o female with history of polysubstance abuse who presents for multiple subcutaneous abscesses overlying left upper arm, shoulder and chest wall. Patient was afebrile on arrival. Labs revealed leukocytosis of 16.8 and mildly elevated lactic acid of 2.1.  She was taken to the OR on 05/04/18 for surgical incision and drainage. This was completed without complication. Leukocytosis resolved and she was afebrile.  She was discharged on augmentin per culture results. Was given oxycodone 10mg  BID for dressing changes and had surgery f/u scheduled for 12/30 for removal of penrose drains.         Discharge Vitals:   BP 113/81 (BP Location: Right Arm)   Pulse 80   Temp 98.5 F (36.9 C) (Oral)   Resp 16   Ht 5\' 7"  (1.702 m)   Wt 68.3 kg   SpO2 97%   BMI 23.57 kg/m   Pertinent Labs, Studies, and Procedures:    Component 6d ago  Specimen Description ABSCESS LEFT SHOULDER CHEST WALL   Special Requests NONE   Gram Stain RARE WBC PRESENT, PREDOMINANTLY PMN  ABUNDANT GRAM POSITIVE COCCI  ABUNDANT GRAM NEGATIVE RODS    ABUNDANT GRAM POSITIVE RODS  Performed at [facility], [address] [place], [place] [address]   Culture ABUNDANT STREPTOCOCCUS GROUP C  MIXED ANAEROBIC FLORA PRESENT. CALL LAB IF FURTHER IID REQUIRED.   Report Status 05/08/2018 FINAL          Discharge Instructions: Discharge Instructions    Change dressing   Complete by:  As directed    Remove dressings and take a shower.  Get soap and water into the open sites.  After you shower dried off, repack each of the wounds.  Use a applicator stick to find the open area.  Open a wet 4 x 4 and pack into the sites.  It does not have to be tight.  The should shrink with time and healing.  They Baker take less packing as they heal.  Use sterile saline as the wetting agent for the 4 x 4's.  Dry dressing over the site.  Dressing changes should be twice a day.   Diet - low sodium heart healthy   Complete by:  As directed    Discharge instructions   Complete by:  As directed    Lori Baker,  You were admitted to the hospital for surgical incision and drainage of a large abscess in your left shoulder. Thankfully, your muscle was not involved. It Baker take several weeks for this to heal. Please reserve the oxycodone for using before dressings changes and take scheduled tylenol (up to 1,000mg  three times a day) and ibuprofen (up to 600mg  three times a day) throughout the day. You have an appointment on 12/30 at 3:45pm with surgery to get the penrose drains removed.   It is expected to have some drainage from the wounds, but if you notice that the drainage is getting thicker, turns yellow/green color, and has a worse smell, please call the orthopedic clinic and see if you can move up your appointment. If not, then go to an urgent care or emergency room for a wound check.   If you experience fevers, chills, nausea, vomiting, diarrhea please go the emergency room as this could be a sign that the infection is returning.   It was a pleasure taking  care of you. I wish you the best!   Increase activity slowly   Complete by:  As directed       Signed: Ali LoweVogel, Doy Taaffe S, MD 05/11/2018, 2:10 PM   Pager: [phone]

## 2018-05-11 NOTE — Progress Notes (Signed)
[facility] Progress Note  6 Days Post-Op   Assessment/Plan Polysubstance abuse- opiates/methamphetamine/heroin-denies recent injections Depression  Abscesses of left arm/shoulder/chest wall S/p I&Dof large communicating abscess of left arm/shoulder/chest wall 12/12 Dr. Gaynelle ArabianWhitePOD #4 - extending down to muscle but not involving the muscle - Cx growing abundant strep group C  ID -vancomycin 12/12>>12/15, merrem 12/12-12/13;Augmentin 12/15>> FEN -reg diet VTE -SCDs, lovenox Foley -none Follow-up: Dr. Cliffton AstersWhite    Plan: pt is okay for discharge from our standpoint. Recommend 4 more days of abx     LOS: 6 days    Subjective: CC: pain with dressing changes  Husband has been doing the dressing changes. She states the oxy help for them. They don't have the money for the copay for the oxy. She denies issues overnight. No fever or chills. She is sweating at night.   Objective: Vital signs in last 24 hours: Temp:  [98.4 F (36.9 C)-98.9 F (37.2 C)] 98.5 F (36.9 C) (12/18 0523) Pulse Rate:  [77-82] 80 (12/18 0523) Resp:  [16-18] 16 (12/18 0523) BP: (113-122)/(71-81) 113/81 (12/18 0523) SpO2:  [97 %-98 %] 97 % (12/18 0523) Last BM Date: 05/10/18  Intake/Output from previous day: 12/17 0701 - 12/18 0700 In: 1197 [P.O.:1197] Out: -  Intake/Output this shift: No intake/output data recorded.  PE: Gen:  Alert, NAD, pleasant, cooperative Pulm:  Rate and effort normal Chest: wounds with penrose drains in place and 2 areas of packing. Very minimi Skin: warm and dry   Anti-infectives: Anti-infectives (From admission, onward)   Start     Dose/Rate Route Frequency Ordered Stop   05/10/18 0000  amoxicillin-clavulanate (AUGMENTIN) 875-125 MG tablet     1 tablet Oral Every 12 hours 05/10/18 1607     05/08/18 2200  amoxicillin-clavulanate (AUGMENTIN) 875-125 MG per tablet 1 tablet     1 tablet Oral Every 12 hours 05/08/18 1120     05/07/18 1600   vancomycin (VANCOCIN) IVPB 750 mg/150 ml premix  Status:  Discontinued     750 mg 150 mL/hr over 60 Minutes Intravenous Every 8 hours 05/07/18 0708 05/08/18 1128   05/07/18 0715  vancomycin (VANCOCIN) 1,250 mg in sodium chloride 0.9 % 250 mL IVPB     1,250 mg 166.7 mL/hr over 90 Minutes Intravenous  Once 05/07/18 0701 05/07/18 0921   05/06/18 1100  ceFEPIme (MAXIPIME) 2 g in sodium chloride 0.9 % 100 mL IVPB  Status:  Discontinued     2 g 200 mL/hr over 30 Minutes Intravenous Every 8 hours 05/06/18 1023 05/08/18 1120   05/06/18 0600  vancomycin (VANCOCIN) IVPB 1000 mg/200 mL premix     1,000 mg 200 mL/hr over 60 Minutes Intravenous  Once 05/05/18 2213 05/06/18 0623   05/05/18 1900  vancomycin (VANCOCIN) 1,500 mg in sodium chloride 0.9 % 500 mL IVPB     1,500 mg 250 mL/hr over 120 Minutes Intravenous  Once 05/05/18 1831 05/05/18 2003   05/05/18 1900  meropenem (MERREM) 2 g in sodium chloride 0.9 % 100 mL IVPB  Status:  Discontinued     2 g 200 mL/hr over 30 Minutes Intravenous  Once 05/05/18 1843 05/06/18 1023      Lab Results:  Recent Labs    05/09/18 1307 05/10/18 0130  WBC 7.4 8.5  HGB 10.1* 9.4*  HCT 32.2* 30.0*  PLT 325 351   BMET Recent Labs    05/10/18 0130  NA 138  K 4.2  CL 103  CO2 27  GLUCOSE 100*  BUN  12  CREATININE 0.76  CALCIUM 8.1*   PT/INR No results for input(s): LABPROT, INR in the last 72 hours. CMP     Component Value Date/Time   NA 138 05/10/2018 0130   K 4.2 05/10/2018 0130   CL 103 05/10/2018 0130   CO2 27 05/10/2018 0130   GLUCOSE 100 (H) 05/10/2018 0130   BUN 12 05/10/2018 0130   CREATININE 0.76 05/10/2018 0130   CALCIUM 8.1 (L) 05/10/2018 0130   PROT 5.6 (L) 05/06/2018 0234   ALBUMIN 2.1 (L) 05/06/2018 0234   AST 31 05/06/2018 0234   ALT 94 (H) 05/06/2018 0234   ALKPHOS 122 05/06/2018 0234   BILITOT 1.0 05/06/2018 0234   GFRNONAA >60 05/10/2018 0130   GFRAA >60 05/10/2018 0130   Lipase  No results found for:  LIPASE  Studies/Results: No results found.    Jerre Simon , [facility] 05/11/2018, 8:16 AM  Pager: [phone] Mon-Wed, Friday 7:00am-4:30pm Thurs 7am-11:30am  Consults: [phone]

## 2018-05-16 ENCOUNTER — Other Ambulatory Visit: Payer: Self-pay

## 2018-05-16 ENCOUNTER — Encounter (HOSPITAL_COMMUNITY): Payer: Self-pay | Admitting: Emergency Medicine

## 2018-05-16 ENCOUNTER — Emergency Department (HOSPITAL_COMMUNITY)
Admission: EM | Admit: 2018-05-16 | Discharge: 2018-05-16 | Disposition: A | Discharge status: Home / Self Care | Payer: Self-pay | Attending: Emergency Medicine | Admitting: Emergency Medicine

## 2018-05-16 DIAGNOSIS — L02213 Cutaneous abscess of chest wall: Secondary | ICD-10-CM | POA: Insufficient documentation

## 2018-05-16 DIAGNOSIS — Z4889 Encounter for other specified surgical aftercare: Secondary | ICD-10-CM

## 2018-05-16 DIAGNOSIS — F17210 Nicotine dependence, cigarettes, uncomplicated: Secondary | ICD-10-CM | POA: Insufficient documentation

## 2018-05-16 DIAGNOSIS — F1910 Other psychoactive substance abuse, uncomplicated: Secondary | ICD-10-CM | POA: Insufficient documentation

## 2018-05-16 LAB — BASIC METABOLIC PANEL
Anion gap: 9 (ref 5–15)
BUN: 19 mg/dL (ref 6–20)
CHLORIDE: 107 mmol/L (ref 98–111)
CO2: 23 mmol/L (ref 22–32)
Calcium: 9.3 mg/dL (ref 8.9–10.3)
Creatinine, Ser: 0.87 mg/dL (ref 0.44–1.00)
GFR calc Af Amer: >60 mL/min (ref >60)
GFR calc non Af Amer: >60 mL/min (ref >60)
Glucose, Bld: 93 mg/dL (ref 70–99)
Potassium: 4.3 mmol/L (ref 3.5–5.1)
Sodium: 139 mmol/L (ref 135–145)

## 2018-05-16 LAB — CBC WITH DIFFERENTIAL/PLATELET
Abs Immature Granulocytes: 0.06 10*3/uL (ref 0.00–0.07)
Basophils Absolute: 0.1 10*3/uL (ref 0.0–0.1)
Basophils Relative: 1 %
Eosinophils Absolute: 0.2 10*3/uL (ref 0.0–0.5)
Eosinophils Relative: 2 %
HCT: 45.5 % (ref 36.0–46.0)
HEMOGLOBIN: 14.3 g/dL (ref 12.0–15.0)
Immature Granulocytes: 1 %
Lymphocytes Relative: 36 %
Lymphs Abs: 3.7 10*3/uL (ref 0.7–4.0)
MCH: 30.3 pg (ref 26.0–34.0)
MCHC: 31.4 g/dL (ref 30.0–36.0)
MCV: 96.4 fL (ref 80.0–100.0)
MONOS PCT: 6 %
Monocytes Absolute: 0.6 10*3/uL (ref 0.1–1.0)
Neutro Abs: 5.7 10*3/uL (ref 1.7–7.7)
Neutrophils Relative %: 54 %
Platelets: 507 10*3/uL — ABNORMAL HIGH (ref 150–400)
RBC: 4.72 MIL/uL (ref 3.87–5.11)
RDW: 14.5 % (ref 11.5–15.5)
WBC: 10.3 10*3/uL (ref 4.0–10.5)
nRBC: 0 % (ref 0.0–0.2)

## 2018-05-16 LAB — I-STAT CG4 LACTIC ACID, ED: Lactic Acid, Venous: 1.3 mmol/L (ref 0.5–1.9)

## 2018-05-16 MED ORDER — SULFAMETHOXAZOLE-TRIMETHOPRIM 800-160 MG PO TABS
1.0000 | ORAL_TABLET | Freq: Once | ORAL | Status: AC
  Administered 2018-05-16: 1 via ORAL
  Filled 2018-05-16: qty 1

## 2018-05-16 MED ORDER — ACETAMINOPHEN 500 MG PO TABS
1000.0000 mg | ORAL_TABLET | Freq: Once | ORAL | Status: AC
  Administered 2018-05-16: 1000 mg via ORAL
  Filled 2018-05-16: qty 2

## 2018-05-16 MED ORDER — SULFAMETHOXAZOLE-TRIMETHOPRIM 800-160 MG PO TABS: 1.0000 | ORAL_TABLET | Freq: Two times a day (BID) | ORAL | 0 refills | Status: AC

## 2018-05-16 MED ORDER — MORPHINE SULFATE (PF) 4 MG/ML IV SOLN: 4.0000 mg | Freq: Once | INTRAVENOUS | Status: DC

## 2018-05-16 MED ORDER — OXYCODONE HCL 5 MG PO TABS
5.0000 mg | ORAL_TABLET | Freq: Once | ORAL | Status: AC
  Administered 2018-05-16: 5 mg via ORAL
  Filled 2018-05-16: qty 1

## 2018-05-16 NOTE — ED Notes (Signed)
See provider note for assessment

## 2018-05-16 NOTE — Discharge Instructions (Signed)
Please see the information and instructions below regarding your visit.  Your diagnoses today include:  1. Chest wall abscess     Abscesses form when an infection in your skin starts to collect bacteria and white blood cells, walling it off from the rest of your body to protect you from a bigger infection. Risk factors for this type of infection include:  ?Break in the skin ?Diabetes ?Swollen areas  Sometimes the infection starts to spread to surrounding tissue, causing redness and swelling. We call this cellulitis.   Tests performed today include: See side panel of your discharge paperwork for testing performed today. Vital signs are listed at the bottom of these instructions.   Medications prescribed:    Take any prescribed medications only as prescribed, and any over the counter medications only as directed on the packaging.  Please take all of your antibiotics until finished.   You may develop abdominal discomfort or nausea from the antibiotic. If this occurs, you may take it with food. Some patients also get diarrhea with antibiotics. You may help offset this with probiotics which you can buy or get in yogurt. Do not eat or take the probiotics until 2 hours after your antibiotic. Some women develop vaginal yeast infections after antibiotics. If you develop unusual vaginal discharge after being on this medication, please see your primary care provider.   Some people develop allergies to antibiotics. Symptoms of antibiotic allergy can be mild and include a flat rash and itching. They can also be more serious and include:  ?Hives - Hives are raised, red patches of skin that are usually very itchy.  ?Lip or tongue swelling  ?Trouble swallowing or breathing  ?Blistering of the skin or mouth.  If you have any of these serious symptoms, please seek emergency medical care immediately.  I recommend Tylenol, 650 mg every 6 hours as needed for pain.  Home care instructions:  Please  follow any educational materials contained in this packet.   Some things that may promote healing of your wound and infection include:  Raise your arm or leg to reduce swelling - Raise the arm or leg up above the level of your heart 3 or 4 times a day, for 30 minutes each time. Keep the infected area clean and dry. You can take a shower or bath, but be sure to pat the area dry with a towel afterward. Do not put any antibiotic ointments or creams on the area. Reapply a dry gauze dressing any time the bandage has become soaked with drainage, or after cleansing the wound.  Apply warm compresses to the wound 3-4 times daily to encourage drainage.   Return instructions:  Please return to the Emergency Department if you experience worsening symptoms. You should return for reevaluation of your infection if you notice spreading redness, increased swelling, an abscess develops, or you develop signs and symptoms of a systemic illness such as fever and chills.  Please return if you have any other emergent concerns.  Additional Information:   Your vital signs today were: BP 109/71 (BP Location: Right Arm)    Pulse 87    Temp 98 F (36.7 C) (Oral)    Resp 14    SpO2 98%  If your blood pressure (BP) was elevated on multiple readings during this visit above 130 for the top number or above 80 for the bottom number, please have this repeated by your primary care provider within one month. --------------  Thank you for allowing us to  participate in your care today.

## 2018-05-16 NOTE — ED Provider Notes (Signed)
[facility]   CSN: [id number] Arrival date & time: 05/16/18  1229     History   Chief Complaint Chief Complaint  Patient presents with  . Wound Infection    HPI Lori Baker is a 32 y.o. female.  HPI  Patient is a 32 year old female with a history of asthma, substance use, abscess of left chest wall presenting for worsening pain, and increasing purulence from her incision and drainage site.  Patient had incision and drainage of a left chest wall abscess on 05-05-18.  Patient has multiple drainage loops in place.  She is performing twice daily packing at home with the assistance of her partner.  She reports that over the past few days, she has seen increasing purulence from the wounds, and the dressing changes have been increasingly painful.  Patient reports that she is still taking the Augmentin.  She reports that the erythema has decreased and she has had no fever or chills.  She has follow-up with surgery on 05-23-2018.  Past Medical History:  Diagnosis Date  . Asthma     Patient Active Problem List   Diagnosis Date Noted  . Abscess of arm 05/05/2018  . Abscess of chest wall     Past Surgical History:  Procedure Laterality Date  . INCISION AND DRAINAGE ABSCESS Left 05/05/2018   Procedure: INCISION AND DRAINAGE LEFT SHOULDER ABSCESS AND DRAIN PLACEMENT;  Surgeon: Andria MeuseWhite, Christopher M, MD;  Location: [facility] OR;  Service: General;  Laterality: Left;  . MOUTH SURGERY       OB History    Gravida  2   Para  1   Term  1   Preterm      AB      Living  1     SAB      TAB      Ectopic      Multiple      Live Births               Home Medications    Prior to Admission medications   Medication Sig Start Date End Date Taking? Authorizing Provider  acetaminophen (TYLENOL) 500 MG tablet Take 2 tablets (1,000 mg total) by mouth every 6 (six) hours. 05/10/18   Ali LoweVogel, Marie S, MD  amoxicillin-clavulanate  (AUGMENTIN) 875-125 MG tablet Take 1 tablet by mouth every 12 (twelve) hours. 05/10/18   Ali LoweVogel, Marie S, MD  citalopram (CELEXA) 40 MG tablet Take 40 mg by mouth daily.  09/20/17   [provider]  ibuprofen (ADVIL,MOTRIN) 400 MG tablet Take 400 mg by mouth every 8 (eight) hours as needed (for pain).  08/28/17   [provider]  nicotine (NICODERM CQ - DOSED IN MG/24 HOURS) 21 mg/24hr patch Place 21 mg onto the skin daily.  11/07/17   [provider]  oxyCODONE (OXY IR/ROXICODONE) 5 MG immediate release tablet Take 2 tablets (10 mg total) by mouth 2 (two) times daily. Before dressing changes 05/10/18   Ali LoweVogel, Marie S, MD  traZODone (DESYREL) 50 MG tablet Take 50-100 mg by mouth at bedtime. 11/06/17   [provider]    Family History Family History  Problem Relation Age of Onset  . Hypertension Mother     Social History Social History   Tobacco Use  . Smoking status: Current Every Day Smoker    Packs/day: 0.25  . Smokeless tobacco: Never Used  Substance Use Topics  . Alcohol use: No  . Drug use:  Yes    Types: IV, Methamphetamines, Cocaine    Comment: heroin 05/15/18     Allergies      Review of Systems Review of Systems  Constitutional: Negative for chills and fever.  Musculoskeletal: Negative for myalgias.  Skin: Positive for wound. Negative for color change.  Neurological: Negative for weakness and numbness.  All other systems reviewed and are negative.    Physical Exam Updated Vital Signs BP 121/74 (BP Location: Right Arm)   Pulse (!) 108   Temp 98 F (36.7 C) (Oral)   Resp 16   SpO2 97%   Physical Exam Vitals signs and nursing note reviewed.  Constitutional:      General: She is not in acute distress.    Appearance: She is well-developed.  HENT:     Head: Normocephalic and atraumatic.  Eyes:     Conjunctiva/sclera: Conjunctivae normal.     Pupils: Pupils are equal, round, and reactive to light.  Neck:      Musculoskeletal: Normal range of motion and neck supple.  Cardiovascular:     Rate and Rhythm: Normal rate and regular rhythm.     Heart sounds: S1 normal and S2 normal. No murmur.  Pulmonary:     Effort: Pulmonary effort is normal.     Breath sounds: Normal breath sounds. No wheezing or rales.  Abdominal:     General: There is no distension.     Palpations: Abdomen is soft.     Tenderness: There is no abdominal tenderness. There is no guarding.  Musculoskeletal: Normal range of motion.        General: No deformity.  Lymphadenopathy:     Cervical: No cervical adenopathy.  Skin:    General: Skin is warm and dry.     Findings: Erythema present.     Comments: See clinical photo for details.  Patient has drainage loops in place in I&D sites.  Erythema is improving, and patient does have what appears to be contact dermatitis from the tape as opposed to cellulitis.  There is a small amount of purulent discharge in the superior incision sites.  Neurological:     Mental Status: She is alert.     Comments: Cranial nerves grossly intact. Patient moves extremities symmetrically and with good coordination.  Psychiatric:        Behavior: Behavior normal.        Thought Content: Thought content normal.        Judgment: Judgment normal.          ED Treatments / Results  Labs (all labs ordered are listed, but only abnormal results are displayed) Labs Reviewed  CBC WITH DIFFERENTIAL/PLATELET - Abnormal; Notable for the following components:      Result Value   Platelets 507 (*)    All other components within normal limits  BASIC METABOLIC PANEL  I-STAT CG4 LACTIC ACID, ED  I-STAT CG4 LACTIC ACID, ED    EKG None  Radiology No results found.  Procedures Procedures (including critical care time)  Medications Ordered in ED Medications  morphine 4 MG/ML injection 4 mg (has no administration in time range)     Initial Impression / Assessment and Plan / ED Course  I have  reviewed the triage vital signs and the nursing notes.  Pertinent labs & imaging results that were available during my care of the patient were reviewed by me and considered in my medical decision making (see chart for details).  Clinical Course as of May 17 1415  Mon May 16, 2018  1416 No lactic acidosis today.   Lactic Acid, Venous: 1.30 [AM]    Clinical Course User Index [AM] Elisha PonderMurray, Alaysha Jefcoat B, PA-C    Patient is nontoxic-appearing, afebrile, and hemodynamically stable.  Pulse 108 on arrival, however patient is crying during this.  Will recheck after pain medication and patient is calm.  Lab work is reviewed today.  No leukocytosis.  Normal lactic acid.  Normal BMP.  No signs of worsening systemic infection based on lab work.  The wound overall appears to be well-healing.  With increasing purulence, and patient's high risk drug use history, will prescribe Bactrim for increased coverage.  I encouraged the patient to take probiotics while receiving double coverage with this.  Case was discussed with Dr. Loren Raceravid Yelverton who reviewed images and is in agreement with the plan of care and increase coverage.  Encourage patient to keep her appointment on December 30.  I had extensive conversation with the patient regarding the use of narcotic pain medicine.  Patient was given 24 pills of Percocet 10-3 25 on discharge from the hospital.  This was 5 days ago.  This was intended mostly for dressing changes per the patient, however patient reports that she has been taking around-the-clock due to the pain.  I encouraged the patient discuss her pain with her primary team, and this was used beyond its intended purpose.  I discussed with the patient that given history of regular heroin use prior to admission to the hospital for this abscess, she has high tolerance for narcotics and prescribing further oxycodone is likely not to be of high benefit, and can lead to harm including respiratory depression.  Patient  was given return precautions for any worsening pain, erythema, drainage, or fever or chills.  Patient is in understanding and agrees with the plan of care.  This is a supervised visit with Dr. Loren Raceravid Yelverton. Evaluation, management, and discharge planning discussed with this attending physician.  Final Clinical Impressions(s) / ED Diagnoses   Final diagnoses:  Chest wall abscess    ED Discharge Orders         Ordered    sulfamethoxazole-trimethoprim (BACTRIM DS,SEPTRA DS) 800-160 MG tablet  2 times daily     05/16/18 1448           Delia ChimesMurray, Pernell Lenoir B, PA-C 05/16/18 1526    Loren RacerYelverton, David, MD 05/17/18 [id number]

## 2018-05-16 NOTE — ED Notes (Signed)
IV attempted 2x °

## 2018-05-16 NOTE — ED Triage Notes (Addendum)
Pt with recent I&D of abscess last week. She reports increased pain with green drainage from packed sites. She state she is taking amoxicillin and is now out of the pain med's because she has been in so much pain. Denies fever. reports using heroin yesterday.

## 2018-05-16 NOTE — ED Notes (Signed)
Patient verbalizes understanding of discharge instructions. Opportunity for questioning and answers were provided. Armband removed by staff, pt discharged from ED.  

## 2018-05-16 NOTE — ED Notes (Signed)
WALKED PATIENT TO THE BATHROOM PATIENT DID WELL PATIENT IS BACK IN ROOM WITH FAMILY AT BEDSIDE

## 2018-05-16 NOTE — ED Notes (Signed)
Dressing applied to wound.  

## 2019-08-02 ENCOUNTER — Emergency Department (HOSPITAL_COMMUNITY)
Admission: EM | Admit: 2019-08-02 | Discharge: 2019-08-02 | Disposition: A | Discharge status: Home / Self Care | Payer: Self-pay | Attending: Emergency Medicine | Admitting: Emergency Medicine

## 2019-08-02 ENCOUNTER — Encounter (HOSPITAL_COMMUNITY): Payer: Self-pay | Admitting: Emergency Medicine

## 2019-08-02 ENCOUNTER — Other Ambulatory Visit: Payer: Self-pay

## 2019-08-02 DIAGNOSIS — F17210 Nicotine dependence, cigarettes, uncomplicated: Secondary | ICD-10-CM | POA: Insufficient documentation

## 2019-08-02 DIAGNOSIS — M7918 Myalgia, other site: Secondary | ICD-10-CM | POA: Insufficient documentation

## 2019-08-02 DIAGNOSIS — F1193 Opioid use, unspecified with withdrawal: Secondary | ICD-10-CM

## 2019-08-02 DIAGNOSIS — F1123 Opioid dependence with withdrawal: Secondary | ICD-10-CM | POA: Insufficient documentation

## 2019-08-02 DIAGNOSIS — R112 Nausea with vomiting, unspecified: Secondary | ICD-10-CM | POA: Insufficient documentation

## 2019-08-02 LAB — POC URINE PREG, ED: Preg Test, Ur: NEGATIVE

## 2019-08-02 MED ORDER — KETOROLAC TROMETHAMINE 60 MG/2ML IM SOLN
30.0000 mg | Freq: Once | INTRAMUSCULAR | Status: AC
  Administered 2019-08-02: 30 mg via INTRAMUSCULAR
  Filled 2019-08-02: qty 2

## 2019-08-02 MED ORDER — ONDANSETRON 4 MG PO TBDP
4.0000 mg | ORAL_TABLET | Freq: Once | ORAL | Status: AC
  Administered 2019-08-02: 4 mg via ORAL
  Filled 2019-08-02: qty 1

## 2019-08-02 MED ORDER — ONDANSETRON 4 MG PO TBDP: 4.0000 mg | ORAL_TABLET | Freq: Three times a day (TID) | ORAL | 0 refills | Status: AC | PRN

## 2019-08-02 MED ORDER — METHOCARBAMOL 500 MG PO TABS
500.0000 mg | ORAL_TABLET | Freq: Once | ORAL | Status: AC
  Administered 2019-08-02: 500 mg via ORAL
  Filled 2019-08-02: qty 1

## 2019-08-02 MED ORDER — NAPROXEN 500 MG PO TABS: 500.0000 mg | ORAL_TABLET | Freq: Two times a day (BID) | ORAL | 0 refills | Status: AC

## 2019-08-02 MED ORDER — DICYCLOMINE HCL 10 MG PO CAPS
20.0000 mg | ORAL_CAPSULE | Freq: Once | ORAL | Status: AC
  Administered 2019-08-02: 20 mg via ORAL
  Filled 2019-08-02: qty 2

## 2019-08-02 MED ORDER — METHOCARBAMOL 500 MG PO TABS: 500.0000 mg | ORAL_TABLET | Freq: Two times a day (BID) | ORAL | 0 refills | Status: AC | PRN

## 2019-08-02 MED ORDER — DICYCLOMINE HCL 20 MG PO TABS: 20.0000 mg | ORAL_TABLET | Freq: Three times a day (TID) | ORAL | 0 refills | Status: AC | PRN

## 2019-08-02 NOTE — ED Triage Notes (Signed)
Pt reports would like help for "heroin withdrawal". Pt reports last use 48 hours ago. Pt reports also using "meth,marijuana." pt denies etoh, si/hi.   Pt reports "my whole body hurts.", nausea, diarrhea, headache. Denies chest pain, shortness of breath.

## 2019-08-02 NOTE — ED Notes (Signed)
Pt walked out of room to nurses station and stated "I am leaving." pt reports medication given helped but symptoms returning and stated did not want to wait for discharge papers or prescriptions. PA aware and stated okay for pt leave.

## 2019-08-02 NOTE — Discharge Instructions (Signed)
You were seen in the emergency department today with complaints of withdrawal from heroin.  We are sending you home with the following medicines to help with your symptoms:  - Naproxen is a nonsteroidal anti-inflammatory medication that will help with pain and swelling. Be sure to take this medication as prescribed with food, 1 pill every 12 hours,  It should be taken with food, as it can cause stomach upset, and more seriously, stomach bleeding. Do not take other nonsteroidal anti-inflammatory medications with this such as Advil, Motrin, Aleve, Mobic, Goodie Powder, or Motrin.    - Robaxin is the muscle relaxer I have prescribed, this is meant to help with muscle tightness. Be aware that this medication may make you drowsy therefore the first time you take this it should be at a time you are in an environment where you can rest. Do not drive or operate heavy machinery when taking this medication. Do not drink alcohol or take other sedating medications with this medicine such as narcotics or benzodiazepines.   -Zofran is a antinausea medication you may take every 8 hours as needed for nausea and vomiting  -Bentyl is a medicine to help with abdominal cramping/spasms, take every 8 hours as needed.  We have prescribed you new medication(s) today. Discuss the medications prescribed today with your pharmacist as they can have adverse effects and interactions with your other medicines including over the counter and prescribed medications. Seek medical evaluation if you start to experience new or abnormal symptoms after taking one of these medicines, seek care immediately if you start to experience difficulty breathing, feeling of your throat closing, facial swelling, or rash as these could be indications of a more serious allergic reaction  Please utilize our resource guide for follow-up within the next 24 hours.  Return to the emergency department for new or worsening symptoms including but not limited to  inability to keep fluids down, fever, passing out, trouble breathing, thoughts of harm, or any other concerns.

## 2019-08-02 NOTE — ED Provider Notes (Signed)
[facility] EMERGENCY DEPARTMENT Provider Note   CSN: [id number] Arrival date & time: 08/02/19  1101     History Chief Complaint  Patient presents with  . Withdrawal    Lori Baker is a 34 y.o. female with a history of tobacco abuse, asthma, and polysubstance abuse (heroin, meth, marijuana) who presents to the ED with complaints of heroin withdrawal since last use 2 days prior. Patient states she has been utilizing heroin & fentanyl via injection daily for an extended period of time. Last use was sometime Monday 07/31/19, since then she has been feeling very poorly. Reports rhinorrhea, chills, nausea, several episodes of emesis & diarrhea, abdominal cramping, generalized body aches/muscle cramps, and shakiness. No alleviating/aggravating factors. Reports additional substance use of intermittent methamphetamines (last use 2 days prior) and marijuana (last use yesterday). She denies fever, chest pain, dyspnea, SI, or HI. She would like to stop using drugs.    HPI     Past Medical History:  Diagnosis Date  . Asthma     Patient Active Problem List   Diagnosis Date Noted  . Abscess of arm 05/05/2018  . Abscess of chest wall     Past Surgical History:  Procedure Laterality Date  . INCISION AND DRAINAGE ABSCESS Left 05/05/2018   Procedure: INCISION AND DRAINAGE LEFT SHOULDER ABSCESS AND DRAIN PLACEMENT;  Surgeon: Andria Meuse, MD;  Location: [facility] OR;  Service: General;  Laterality: Left;  . MOUTH SURGERY       OB History    Gravida  2   Para  1   Term  1   Preterm      AB      Living  1     SAB      TAB      Ectopic      Multiple      Live Births              Family History  Problem Relation Age of Onset  . Hypertension Mother     Social History   Tobacco Use  . Smoking status: Current Every Day Smoker    Packs/day: 0.25  . Smokeless tobacco: Never Used  Substance Use Topics  . Alcohol use: No  . Drug use: Yes    Types: IV,  Methamphetamines, Marijuana    Comment: last heroin use 07/31/19    Home Medications Prior to Admission medications   Medication Sig Start Date End Date Taking? Authorizing Provider  acetaminophen (TYLENOL) 500 MG tablet Take 2 tablets (1,000 mg total) by mouth every 6 (six) hours. 05/10/18   Ali Lowe, MD  amoxicillin-clavulanate (AUGMENTIN) 875-125 MG tablet Take 1 tablet by mouth every 12 (twelve) hours. 05/10/18   Ali Lowe, MD  citalopram (CELEXA) 40 MG tablet Take 40 mg by mouth daily.  09/20/17   [provider]  ibuprofen (ADVIL,MOTRIN) 400 MG tablet Take 400 mg by mouth every 8 (eight) hours as needed (for pain).  08/28/17   [provider]  nicotine (NICODERM CQ - DOSED IN MG/24 HOURS) 21 mg/24hr patch Place 21 mg onto the skin daily.  11/07/17   [provider]  oxyCODONE (OXY IR/ROXICODONE) 5 MG immediate release tablet Take 2 tablets (10 mg total) by mouth 2 (two) times daily. Before dressing changes 05/10/18   Ali Lowe, MD  traZODone (DESYREL) 50 MG tablet Take 50-100 mg by mouth at bedtime. 11/06/17   [provider]    Allergies  Penicillins  Review of Systems   Review of Systems Constitutional: Positive for chills. Negative for fever.  HENT: Positive for rhinorrhea.   Respiratory: Negative for cough and shortness of breath.   Cardiovascular: Negative for chest pain.  Gastrointestinal: Positive for abdominal pain, diarrhea, nausea and vomiting.  Genitourinary: Negative for dysuria.  Musculoskeletal: Positive for myalgias.  Skin: Negative for wound.  Neurological: Negative for syncope.  Psychiatric/Behavioral: Negative for hallucinations and suicidal ideas.       Negative for homicidal ideations.   All other systems reviewed and are negative.  Physical Exam Updated Vital Signs BP 110/78   Pulse 84   Temp 98.4 F (36.9 C) (Oral)   Resp 18   Ht 5\' 7"  (1.702 m)   Wt 58 kg   LMP 07/29/2019   SpO2 99%   BMI 20.03  kg/m   Physical Exam  Vitals and nursing note reviewed.  Constitutional:      General: He is not in acute distress.    Appearance: He is well-developed. He is not toxic-appearing.  HENT:     Head: Normocephalic and atraumatic.  Eyes:     General:        Right eye: No discharge.        Left eye: No discharge.     Conjunctiva/sclera: Conjunctivae normal.  Cardiovascular:     Rate and Rhythm: Normal rate and regular rhythm.     Heart sounds: No murmur.  Pulmonary:     Effort: Pulmonary effort is normal. No respiratory distress.     Breath sounds: Normal breath sounds. No wheezing, rhonchi or rales.  Abdominal:     General: There is no distension.     Palpations: Abdomen is soft.     Tenderness: There is no abdominal tenderness. There is no guarding or rebound.  Musculoskeletal:     Cervical back: Neck supple.     Comments: Well healed surgical scars to L shoulder.   Skin:    General: Skin is warm and dry.     Findings: No petechiae or rash.  Neurological:     Mental Status: He is alert.     Comments: Clear speech.   Psychiatric:        Attention and Perception: He does not perceive auditory or visual hallucinations.        Mood and Affect: Mood is anxious.        Behavior: Behavior is agitated.        Thought Content: Thought content does not include homicidal or suicidal ideation. Thought content does not include homicidal or suicidal plan.   ED Results / Procedures / Treatments   Labs (all labs ordered are listed, but only abnormal results are displayed) Labs Reviewed - No data to display  EKG EKG Interpretation  Date/Time:  Wednesday August 02 2019 11:20:38 EST Ventricular Rate:  80 PR Interval:    QRS Duration: 92 QT Interval:  380 QTC Calculation: 439 R Axis:   82 Text Interpretation: Sinus rhythm Borderline short PR interval Biatrial enlargement Minimal ST depression, diffuse leads Baseline wander in lead(s) V4 No old tracing to compare Confirmed by [birth year] [phone]) on 08/02/2019 11:27:19 AM   Radiology No results found.  Procedures Procedures (including critical care time)  Medications Ordered in ED Medications - No data to display  ED Course  I have reviewed the triage vital signs and the nursing notes.  Pertinent labs & imaging results that were available during my care of the patient  were reviewed by me and considered in my medical decision making (see chart for details).    MDM Rules/Calculators/A&P                      Patient presents to the ED with complaints of drug withdrawal as detailed above.  Nontoxic, vitals without significant abnormality. Anxious, agitated. No chest pain/dyspnea/fevers/murmur to suggest endocarditis. Abdomen nontender w/o peritoneal signs- do not suspect acute surgical abdomen. Pregnancy test negative. Patient refusing IV establishment, will check basic abdominal labs given vomiting/diarrhea.   Patient informed nursing staff she wished to leave, refused blood work. She tolerated PO meds here int he ED. She is not suicidal or homicidal, attempted to prepare discharge papework with prescriptions, however patient left prior to receiving these and stated she did not want them. Will keep in the ED incase patient decides to return for paperwork. Consult was placed to peer support.   Final Clinical Impression(s) / ED Diagnoses Final diagnoses:  Opioid withdrawal ([place])    Rx / DC Orders ED Discharge Orders         Ordered    naproxen (NAPROSYN) 500 MG tablet  2 times daily     08/02/19 1332    methocarbamol (ROBAXIN) 500 MG tablet  2 times daily PRN     08/02/19 1332    ondansetron (ZOFRAN ODT) 4 MG disintegrating tablet  Every 8 hours PRN     08/02/19 1332    dicyclomine (BENTYL) 20 MG tablet  Every 8 hours PRN     08/02/19 1332           Shaqueena Mauceri, [place] R, PA-C 08/02/19 1338    Hayden Rasmussen, MD 08/02/19 1914

## 2019-08-02 NOTE — ED Notes (Signed)
Pt called out to nurses station several times. Went in to assess pt, pt noted to be restless, anxious and reports "you don't understand, my skin is on fire, I can't take this, can't I have something to eat/drink." pt tearful and aware waiting for provider.   Comfort measures offered. Pt refused. " I don't want anything touching me."

## 2020-02-26 ENCOUNTER — Ambulatory Visit: Payer: Self-pay | Admitting: Physician Assistant

## 2022-04-10 DIAGNOSIS — R69 Illness, unspecified: Secondary | ICD-10-CM | POA: Diagnosis not present

## 2022-04-24 DIAGNOSIS — R69 Illness, unspecified: Secondary | ICD-10-CM | POA: Diagnosis not present
